# Patient Record
Sex: Female | Born: 1975 | Hispanic: Yes | Marital: Married | Smoking: Current every day smoker
Health system: Southern US, Community
[De-identification: ages and names within clinical notes are randomized; demographics above are authoritative.]

---

## 2019-09-28 ENCOUNTER — Other Ambulatory Visit: Payer: Self-pay

## 2019-09-28 DIAGNOSIS — Z20822 Contact with and (suspected) exposure to covid-19: Secondary | ICD-10-CM

## 2019-09-30 LAB — NOVEL CORONAVIRUS, NAA: SARS-CoV-2, NAA: NOT DETECTED

## 2020-04-03 ENCOUNTER — Other Ambulatory Visit: Payer: Self-pay

## 2020-04-03 DIAGNOSIS — Z1231 Encounter for screening mammogram for malignant neoplasm of breast: Secondary | ICD-10-CM

## 2020-04-18 ENCOUNTER — Ambulatory Visit
Admission: RE | Admit: 2020-04-18 | Discharge: 2020-04-18 | Disposition: A | Discharge status: Home / Self Care | Payer: No Typology Code available for payment source | Source: Ambulatory Visit | Attending: Obstetrics and Gynecology | Admitting: Obstetrics and Gynecology

## 2020-04-18 ENCOUNTER — Other Ambulatory Visit: Payer: Self-pay

## 2020-04-18 ENCOUNTER — Ambulatory Visit: Payer: Self-pay | Admitting: *Deleted

## 2020-04-18 VITALS — BP 143/86 | Temp 98.4°F | Wt 184.6 lb

## 2020-04-18 DIAGNOSIS — Z01419 Encounter for gynecological examination (general) (routine) without abnormal findings: Secondary | ICD-10-CM

## 2020-04-18 DIAGNOSIS — Z124 Encounter for screening for malignant neoplasm of cervix: Secondary | ICD-10-CM

## 2020-04-18 DIAGNOSIS — Z1231 Encounter for screening mammogram for malignant neoplasm of breast: Secondary | ICD-10-CM

## 2020-04-18 NOTE — Patient Instructions (Addendum)
Informed Jessica Hancock about breast self awareness. Patient received a Pap smear today. Let her know BCCCP will cover Pap smears and HPV typing every 5 years unless has a history of abnormal Pap smears. Referred patient to the [facility] of [facility] for a screening mammogram. Appointment scheduled for April 18, 2020 at 9:30am. Patient aware of appointment and will be there. Let patient know will follow up with her within the next week with results of her Pap smear and wet prep by phone. Informed patient that the Breast Center will follow-up wither her within the next couple of weeks with results of her mammogram by letter or phone. Jessica Hancock verbalized understanding.  Mila Homer, RN, FNP student 9:17 AM

## 2020-04-18 NOTE — Progress Notes (Signed)
[name] Jessica Hancock is a 44 y.o. G1P0 female who presents to [facility] clinic today with no complaints.    Pap Smear: Pap smear completed today. Last Pap smear was in 2016 in [place] and was normal per patient. Per patient she has no history of an abnormal Pap smear. Last Pap smear result is not available in Epic.   Physical exam: Breasts Breasts symmetrical. No skin abnormalities bilateral breasts. Nipple retraction bilateral breasts but this is normal per patient. No nipple discharge bilateral breasts. No lymphadenopathy. No lumps palpated bilateral breasts. There is an old scar in the left axillary region that the patient states was from self removal of a pimple.      Pelvic/Bimanual Ext Genitalia No lesions, no swelling and no discharge observed on external genitalia.        Vagina Vagina pink and normal texture. No lesions or discharge observed in vagina.        Cervix Cervix is present. Cervix pink and of normal texture. Cervix slightly friable. Light yellow discharge observed. Wet prep completed.   Uterus Uterus is present and palpable. Uterus in normal position and normal size.        Adnexae Bilateral ovaries present and palpable. No tenderness on palpation.         Rectovaginal No rectal exam completed today since patient had no rectal complaints. No skin abnormalities observed on exam.     Smoking History: Patient is a current smoker. She was referred to the [place] quit line.    Patient Navigation: Patient education provided. Access to services provided for patient through BCCCP program. Laveda Norman, Spanish interpreter, provided through [facility].    Breast and Cervical Cancer Risk Assessment: Patient does not have family history of breast cancer, known genetic mutations, or radiation treatment to the chest before age 89. Patient does not have history of cervical dysplasia, immunocompromised, or DES exposure in-utero.  Risk Assessment    Risk Scores       04/18/2020   Last edited by: Narda Rutherford, LPN   5-year risk: 0.5 %   Lifetime risk: 8.5 %          A: BCCCP exam with pap smear No complaints today.  P: Referred patient to the [facility] of [facility] for a screening mammogram. Appointment scheduled 04/18/2020 at 9:30am.  Mila Homer, RN, FNP student 04/18/2020 9:08 AM   Attestation of Supervision of Student:  I confirm that I have verified the information documented in the nurse practitioner student's note and that I have also personally reperformed the history, physical exam and all medical decision making activities.  I have verified that all services and findings are accurately documented in this student's note; and I agree with management and plan as outlined in the documentation. I have also made any necessary editorial changes.  Brannock, Kathaleen Maser, RN [facility] for [employer], [facility] 04/18/2020 9:28 AM

## 2020-04-19 LAB — CYTOLOGY - PAP
Comment: NEGATIVE
Diagnosis: NEGATIVE
High risk HPV: NEGATIVE

## 2020-04-19 LAB — CERVICOVAGINAL ANCILLARY ONLY
Bacterial Vaginitis (gardnerella): NEGATIVE
Candida Glabrata: NEGATIVE
Candida Vaginitis: NEGATIVE
Comment: NEGATIVE
Comment: NEGATIVE
Comment: NEGATIVE
Comment: NEGATIVE
Trichomonas: NEGATIVE

## 2020-04-23 ENCOUNTER — Other Ambulatory Visit: Payer: Self-pay | Admitting: Obstetrics and Gynecology

## 2020-04-23 DIAGNOSIS — R928 Other abnormal and inconclusive findings on diagnostic imaging of breast: Secondary | ICD-10-CM

## 2020-04-25 ENCOUNTER — Telehealth: Payer: Self-pay

## 2020-04-25 NOTE — Telephone Encounter (Signed)
Via Julie Sowell, Spanish Interpreter, Patient informed negative Pap/HPV results. Patient verbalized understanding.  

## 2020-04-30 ENCOUNTER — Ambulatory Visit
Admission: RE | Admit: 2020-04-30 | Discharge: 2020-04-30 | Disposition: A | Discharge status: Home / Self Care | Payer: No Typology Code available for payment source | Source: Ambulatory Visit | Attending: Obstetrics and Gynecology | Admitting: Obstetrics and Gynecology

## 2020-04-30 ENCOUNTER — Other Ambulatory Visit: Payer: Self-pay

## 2020-04-30 DIAGNOSIS — R928 Other abnormal and inconclusive findings on diagnostic imaging of breast: Secondary | ICD-10-CM

## 2020-07-23 ENCOUNTER — Inpatient Hospital Stay: Payer: No Typology Code available for payment source

## 2020-07-23 ENCOUNTER — Inpatient Hospital Stay: Payer: No Typology Code available for payment source | Attending: Genetic Counselor | Admitting: Genetic Counselor

## 2021-03-20 ENCOUNTER — Telehealth: Payer: Self-pay

## 2021-03-20 NOTE — Telephone Encounter (Signed)
Copied from CRM [id number]. Topic: General - Other >> Mar 20, 2021 12:06 PM Pawlus, Maxine Glenn A wrote: Reason for CRM: Pt requested a call back from [place] to apply for an [employer].

## 2021-03-20 NOTE — Telephone Encounter (Signed)
I return Pt call, she was inform that she need to see the provider first before she can apply for the financial program at [facility]

## 2021-03-25 ENCOUNTER — Other Ambulatory Visit: Payer: Self-pay | Admitting: Obstetrics and Gynecology

## 2021-03-25 DIAGNOSIS — Z1231 Encounter for screening mammogram for malignant neoplasm of breast: Secondary | ICD-10-CM

## 2021-05-15 ENCOUNTER — Ambulatory Visit: Admission: RE | Admit: 2021-05-15 | Payer: Self-pay | Source: Ambulatory Visit

## 2021-05-15 ENCOUNTER — Other Ambulatory Visit: Payer: Self-pay

## 2021-05-15 ENCOUNTER — Ambulatory Visit: Payer: No Typology Code available for payment source | Admitting: *Deleted

## 2021-05-15 VITALS — BP 120/82 | Wt 189.5 lb

## 2021-05-15 DIAGNOSIS — Z1239 Encounter for other screening for malignant neoplasm of breast: Secondary | ICD-10-CM

## 2021-05-15 DIAGNOSIS — N6320 Unspecified lump in the left breast, unspecified quadrant: Secondary | ICD-10-CM

## 2021-05-15 NOTE — Patient Instructions (Addendum)
Explained breast self awareness with Moishe Spice. Patient did not need a Pap smear today due to last Pap smear and HPV typing was 04/18/2020. Let her know BCCCP will cover Pap smears and HPV typing every 5 years unless has a history of abnormal Pap smears. Referred patient to the [facility] of [facility] for a diagnostic mammogram. Appointment scheduled Thursday, May 29, 2021 at 0940. Patient aware of appointment and will be there. Patient is a current smoker. Discussed smoking cessation with patient. Referred to the [facility] Quitline and gave resources to the free smoking cessation classes at [facility]. Blanchard Kelch Rosario Karlene Einstein verbalized understanding.  Darlina Mccaughey, Kathaleen Maser, RN 1:23 PM

## 2021-05-15 NOTE — Progress Notes (Addendum)
[name] is a 45 y.o. female who presents to [facility] clinic today with complaint of left breast lump x 2.5 months.    Pap Smear: Pap smear not completed today. Last Pap smear was 04/18/2020 at [facility] and was normal with negative HPV. Per patient has no history of an abnormal Pap smear. Last Pap smear result is available in Epic.   Physical exam: Breasts Breasts symmetrical. No skin abnormalities bilateral breasts. Bilateral nipple retraction that is greater within the left breast that per patient is normal for her. No nipple discharge bilateral breasts. No lymphadenopathy. No lumps palpated right breast. Palpated a lump within the left breast between 10 o'clock and 2 o'clock above the nipple. No complaints of pain or tenderness on exam.  Patient has two dark colors moles that have irregular border and a lighter color brown surrounding mole. Advised patient to follow-up with her PCP to have them checked. Patient doesn't have a PCP. Referred patient to the [facility] and she will be referred to a PCP for follow-up.  MS DIGITAL SCREENING TOMO BILATERAL  Result Date: 04/19/2020 CLINICAL DATA:  Screening. EXAM: DIGITAL SCREENING BILATERAL MAMMOGRAM WITH TOMO AND CAD COMPARISON:  None. ACR Breast Density Category c: The breast tissue is heterogeneously dense, which may obscure small masses. FINDINGS: In the left breast, a possible 12 o'clock retroareolar mass warrants further evaluation. In the right breast, no findings suspicious for malignancy. Images were processed with CAD. IMPRESSION: Further evaluation is suggested for possible mass in the left breast. RECOMMENDATION: Ultrasound of the left breast. (Code:US-L-64M) The patient will be contacted regarding the findings, and additional imaging will be scheduled. BI-RADS CATEGORY  0: Incomplete. Need additional imaging evaluation and/or prior mammograms for comparison. Electronically Signed   By: Beckie Salts M.D.   On:  04/19/2020 15:22     Pelvic/Bimanual Pap is not indicated today per BCCCP guidelines.   Smoking History: Patient is a current smoker. Discussed smoking cessation with patient. Referred to the [facility] Quitline and gave resources to the free smoking cessation classes at [facility].   Patient Navigation: Patient education provided. Access to services provided for patient through [facility]. Spanish interpreter Natale Lay from [facility] provided.    Breast and Cervical Cancer Risk Assessment: Patient has family history of her mother having breast cancer. Patient has no known genetic mutations or history of radiation treatment to the chest before age 37. Patient does not have history of cervical dysplasia, immunocompromised, or DES exposure in-utero.  Risk Assessment     Risk Scores       05/15/2021 04/18/2020   Last edited by: Jessica Rutherford, LPN McGill, Jessica Demetrius Charity, LPN   5-year risk: 1.1 % 0.5 %   Lifetime risk: 14.4 % 8.5 %            A: BCCCP exam without pap smear Complaint of left breast lump.  P: Referred patient to the [facility] for a diagnostic mammogram. Appointment scheduled Thursday, May 29, 2021 at 0940.  Jessica Heidelberg, RN 05/15/2021 1:23 PM

## 2021-05-21 ENCOUNTER — Other Ambulatory Visit: Payer: Self-pay

## 2021-05-21 ENCOUNTER — Inpatient Hospital Stay: Payer: Self-pay | Attending: Obstetrics and Gynecology | Admitting: *Deleted

## 2021-05-21 VITALS — BP 140/90 | Ht 63.0 in | Wt 187.9 lb

## 2021-05-21 DIAGNOSIS — Z0000 Encounter for general adult medical examination without abnormal findings: Secondary | ICD-10-CM

## 2021-05-21 NOTE — Progress Notes (Signed)
Wisewoman initial screening   Interpreter- Alene Mires, Haroldine Laws   Clinical Measurement:  Vitals:   05/21/21 0930  BP: (!) 142/90   Fasting Labs Drawn Today, will review with patient when they result.   Medical History:  Patient states that she  does not know if she has  high cholesterol, has high blood pressure and she does not have diabetes.  Medications:  Patient states that she does not take medication to lower cholesterol, blood pressure or blood sugar.  Patient does not take an aspirin a day to help prevent a heart attack or stroke.    Blood pressure, self measurement: Patient states that she does measure blood pressure from home. She checks her blood pressure N/A. She shares her readings with a health care provider: N/A.   Nutrition: Patient states that on average she eats 1 cups of fruit and 0 cups of vegetables per day. Patient states that she does not eat fish at least 2 times per week. Patient eats less than half servings of whole grains. Patient drinks less than 36 ounces of beverages with added sugar weekly: yes. Patient is currently watching sodium or salt intake: yes. In the past 7 days patient has consumed drinks containing alcohol on 1 days. On a day that patient consumes drinks containing alcohol on average 2 drinks are consumed.      Physical activity:  Patient states that she gets 0 minutes of moderate and 0 minutes of vigorous physical activity each week.  Smoking status:  Patient states that she has is a current smoker .   Quality of life:  Over the past 2 weeks patient states that she had little interest or pleasure in doing things: not at all. She has been feeling down, depressed or hopeless:not at all.    Risk reduction and counseling:   Smoking Cessation: Referred patient to the [place] Quitline for smoking cessation.  Health Coaching:  Spoke with patient about the daily recommendation for fruits and vegetables (2 cups of fruit and 3 cups of vegetables). Patient eats a  serving of fish week. Patient primarily consumes tilapia and sometimes fish. Gave recommendations for heart healthy fish that patient can add into diet (salmon, tuna, mackerel, sardines, sea bass or trout). Patient does not consume any whole grain foods. Gave recommendations for whole grains that she can add into diet (whole wheat pasta, whole wheat bread, brown rice, oatmeal and whole grain cereals). Patient does not currently exercise. Encouraged patient to try and start exercising at least 20 minutes a day.   Navigation:  I will notify patient of lab results.  Patient is aware of 2 more health coaching sessions and a follow up. Will refer patient to Internal Medicine for elevated blood pressure.   Time: 25 minutes

## 2021-05-22 LAB — LIPID PANEL
Chol/HDL Ratio: 3.5 ratio (ref 0.0–4.4)
Cholesterol, Total: 213 mg/dL — ABNORMAL HIGH (ref 100–199)
HDL: 61 mg/dL (ref >39)
LDL Chol Calc (NIH): 135 mg/dL — ABNORMAL HIGH (ref 0–99)
Triglycerides: 96 mg/dL (ref 0–149)
VLDL Cholesterol Cal: 17 mg/dL (ref 5–40)

## 2021-05-22 LAB — HEMOGLOBIN A1C
Est. average glucose Bld gHb Est-mCnc: 177 mg/dL
Hgb A1c MFr Bld: 7.8 % — ABNORMAL HIGH (ref 4.8–5.6)

## 2021-05-22 LAB — GLUCOSE, RANDOM: Glucose: 170 mg/dL — ABNORMAL HIGH (ref 65–99)

## 2021-05-28 ENCOUNTER — Telehealth: Payer: Self-pay

## 2021-05-28 NOTE — Telephone Encounter (Signed)
Health coaching 2   interpreter- Natale Lay, [facility]   Labs-213 cholesterol, 135 LDL cholesterol, 96  triglycerides, 61 HDL cholesterol, 7.8 hemoglobin A1C, 170 mean plasma glucose. Patient understands and is aware of her lab results.   Goals-   Reduce the amount of fried and fatty foods consumed. Try to grill, bake, broil or sautee foods in olive oil instead. Reduce the amount of red meat consumed. Substitute for lean protein like chicken, fish, [place] or lean cute of pork.  Increase the amount of whole grain foods and heart healthy fish. Gave recommendations for different whole grains and heart healthy fish that patient can try adding into diet. Decrease the amount of sweets and sugars consumed in both food and drinks. Patient consumes chocolates sometimes and ice cream/ice pops. Suggested to patient to look for sugar-free alternatives.  Reduce the amount of carbs consumed. Patient averages 6 tortillas a day and consumes rice twice a week and potatoes twice a week. Spoke with patient about cutting back on the amount of carbs consumed. Suggested to patient that she start measuring out her servings of rice and potatoes that she consumes to help with reducing the size of her portions. Also suggested to patient that she cut back by about 1/2 with the amount of tortillas consumed daily.    Navigation:  Patient is aware of 1 more health coaching sessions and a follow up.Will call patient with follow-up appointment information for Internal Medicine once appointment has been scheduled.   Time- 20 minutes

## 2021-05-29 ENCOUNTER — Ambulatory Visit
Admission: RE | Admit: 2021-05-29 | Discharge: 2021-05-29 | Disposition: A | Discharge status: Home / Self Care | Payer: No Typology Code available for payment source | Source: Ambulatory Visit | Attending: Obstetrics and Gynecology | Admitting: Obstetrics and Gynecology

## 2021-05-29 ENCOUNTER — Other Ambulatory Visit: Payer: Self-pay

## 2021-05-29 DIAGNOSIS — N6320 Unspecified lump in the left breast, unspecified quadrant: Secondary | ICD-10-CM

## 2021-06-10 ENCOUNTER — Encounter: Payer: No Typology Code available for payment source | Admitting: Student

## 2021-06-17 ENCOUNTER — Encounter: Payer: Self-pay | Admitting: Internal Medicine

## 2021-06-17 ENCOUNTER — Ambulatory Visit (INDEPENDENT_AMBULATORY_CARE_PROVIDER_SITE_OTHER): Payer: Self-pay | Admitting: Internal Medicine

## 2021-06-17 ENCOUNTER — Other Ambulatory Visit: Payer: Self-pay

## 2021-06-17 ENCOUNTER — Other Ambulatory Visit (HOSPITAL_COMMUNITY): Payer: Self-pay

## 2021-06-17 VITALS — BP 128/86 | HR 87 | Temp 98.2°F | Resp 24 | Ht 60.0 in | Wt 189.0 lb

## 2021-06-17 DIAGNOSIS — E119 Type 2 diabetes mellitus without complications: Secondary | ICD-10-CM

## 2021-06-17 DIAGNOSIS — E785 Hyperlipidemia, unspecified: Secondary | ICD-10-CM | POA: Insufficient documentation

## 2021-06-17 DIAGNOSIS — Z789 Other specified health status: Secondary | ICD-10-CM | POA: Insufficient documentation

## 2021-06-17 DIAGNOSIS — E669 Obesity, unspecified: Secondary | ICD-10-CM | POA: Insufficient documentation

## 2021-06-17 DIAGNOSIS — Z720 Tobacco use: Secondary | ICD-10-CM

## 2021-06-17 DIAGNOSIS — Z6836 Body mass index (BMI) 36.0-36.9, adult: Secondary | ICD-10-CM

## 2021-06-17 DIAGNOSIS — E1169 Type 2 diabetes mellitus with other specified complication: Secondary | ICD-10-CM | POA: Insufficient documentation

## 2021-06-17 DIAGNOSIS — E782 Mixed hyperlipidemia: Secondary | ICD-10-CM

## 2021-06-17 MED ORDER — METFORMIN HCL 500 MG PO TABS
500.0000 mg | ORAL_TABLET | Freq: Every day | ORAL | 2 refills | Status: DC
  Filled 2021-06-17: qty 30, 30d supply, fill #0
  Filled 2021-07-22: qty 30, 30d supply, fill #1
  Filled 2021-08-22: qty 30, 30d supply, fill #2

## 2021-06-17 NOTE — Patient Instructions (Addendum)
Thank you, [name] for allowing [place] to provide your care today. Today we discussed cholesterol, diabetes, and cardiovascular risk.    Labs/Tests Ordered: Lab Orders  No laboratory test(s) ordered today     Referrals Ordered:  Referral Orders  No referral(s) requested today     Medication Changes:   Meds ordered this encounter  Medications   metFORMIN (GLUCOPHAGE) 500 MG tablet    Sig: Take 1 tablet (500 mg total) by mouth daily with breakfast.    Dispense:  30 tablet    Refill:  2    IM program     Instructions:   Follow up: 3 months with Dr. Marchia Bond  Remember: If you have any questions or concerns, call our clinic at [phone] or after hours call [phone] and ask for the internal medicine resident on call.  Dellia Cloud, D.O. [facility]

## 2021-06-17 NOTE — Assessment & Plan Note (Addendum)
HPI: Patient presents for further evaluation and management of HLD.  Patient was recently diagnosed with hyperlipidemia from a recent wisewoman visit showing an elevated total cholesterol of 213 and LDL of 135.  Patient qualifies for moderate intensity statin due to her recent diagnosis of diabetes as well as her lifetime risk of ASCVD > 39%.  Patient's most significant risk factors for ASCVD include tobacco use, obesity, diabetes, and hyperlipidemia.  Lipid Panel: Component Value Date/Time   CHOL 213 (H) 05/21/2021 1004   TRIG 96 05/21/2021 1004   HDL 61 05/21/2021 1004   LDLCALC 135 (H) 05/21/2021 1004  ASCVD risk: 10-year 6.4%, Lifetime 50%  Assessment/Plan: Patient has a new diagnosis of mixed hyperlipidemia.  She will require moderate intensity statin in the near future.  We will hold off on starting this currently as she has expressed feeling overwhelmed by being diagnosed with diabetes and high cholesterol at the same time.  Patient has a follow-up appointment in 3 months.  -Start atorvastatin 20 mg at her follow-up appointment -We will need additional counseling regarding tobacco cessation at that time. -Follow-up in 3 months

## 2021-06-17 NOTE — Assessment & Plan Note (Addendum)
HPI: Patient presents Follow-up from her recent wisewoman visit with an elevated hemoglobin A1c.  Patient's A1c was 7.8, which is a new diagnosis of Type II diabetes mellitus.  Patient has a family history of diabetes in her mother.  Hemoglobin A1C Latest Ref Rng & Units 05/21/2021  HGBA1C 4.8 - 5.6 % 7.8(H)  Some recent data might be hidden    Assessment/Plan: Patient presents with a new diagnosis of diabetes mellitus.  The majority of this visit was spent counseling the patient regarding this diagnosis and its implications in her daily life.  We discussed general lifestyle changes and I gave her additional information for her to read at home regarding diabetes.  Patient agreed to start metformin 500 mg daily.  Would recommend adding GLP-1 agonist in the near future for additional weight control.  -Counseled regarding diabetes and lifestyle changes -Started metformin 500 mg daily -We will need to consider GLP-1 agonist in the near future -We will need diabetic foot and eye exam at 3 month follow up.

## 2021-06-17 NOTE — Progress Notes (Signed)
    Subjective:  CC: HLD   HPI:  [name] is a 45 y.o. female with a past medical history stated below and presents today for HLD. Please see problem based assessment and plan for additional details.  No past medical history on file.  No current outpatient medications on file prior to visit.   No current facility-administered medications on file prior to visit.    Family History  Problem Relation Age of Onset   Diabetes Mother    Breast cancer Mother    Hypertension Father     Social History   Socioeconomic History   Marital status: Married    Spouse name: Not on file   Number of children: 1   Years of education: Not on file   Highest education level: Associate degree: occupational, Scientist, product/process development, or vocational program  Occupational History   Not on file  Tobacco Use   Smoking status: Every Day    Packs/day: 0.10    Years: 15.00    Pack years: 1.50    Types: Cigarettes   Smokeless tobacco: Never   Tobacco comments:    2-3 cigarettes daily  Vaping Use   Vaping Use: Never used  Substance and Sexual Activity   Alcohol use: Yes    Comment: occasionally   Drug use: Never   Sexual activity: Yes    Birth control/protection: None  Other Topics Concern   Not on file  Social History Narrative   Not on file   Social Determinants of Health   Financial Resource Strain: Not on file  Food Insecurity: No Food Insecurity   Worried About Running Out of Food in the Last Year: Never true   Ran Out of Food in the Last Year: Never true  Transportation Needs: No Transportation Needs   Lack of Transportation (Medical): No   Lack of Transportation (Non-Medical): No  Physical Activity: Not on file  Stress: Not on file  Social Connections: Not on file  Intimate Partner Violence: Not on file    Review of Systems: ROS negative except for what is noted on the assessment and plan.  Objective:   Vitals:   06/17/21 0843  BP: 128/86  Pulse: 87   Resp: (!) 24  Temp: 98.2 F (36.8 C)  TempSrc: Oral  SpO2: 99%  Weight: 189 lb (85.7 kg)  Height: 5' (1.524 m)    Physical Exam: Gen: A&O x3 and in no apparent distress, well appearing and nourished. HEENT:    Head - normocephalic, atraumatic.    Eye - visual acuity grossly intact, conjunctiva clear, sclera non-icteric, EOM intact.    Mouth - No obvious caries or periodontal disease. Neck: no masses or nodules, AROM intact. CV: RRR, no murmurs, S1/S2 presents  Resp: Clear to ascultation bilaterally  Abd: BS (+) x4, soft, non-tender abdomen, without hepatosplenomegaly or masses MSK: Grossly normal AROM and strength x4 extremities. Skin: good skin turgor, no rashes, unusual bruising, or prominent lesions.  Neuro: No focal deficits, grossly normal sensation and coordination.  Psych: Oriented x3 and responding appropriately. Intact memory, normal mood, judgement, affect, and insight.    Assessment & Plan:  See Encounters Tab for problem based charting.  Patient discussed with Dr. Jeral Pinch, D.O. [facility]  PGY-3 Pager: [phone]  Phone: [phone] Date 06/17/2021  Time 8:53 AM

## 2021-06-18 ENCOUNTER — Encounter: Payer: Self-pay | Admitting: Internal Medicine

## 2021-06-18 NOTE — Assessment & Plan Note (Signed)
Patient endorses drinking 3-4 beers daily.  She denies any history of alcohol withdrawal or seizures.   Plan: -I counseled the patient regarding reduction of alcohol use -I educated her regarding the signs and symptoms of alcohol withdrawal and when to seek assistance.

## 2021-06-18 NOTE — Assessment & Plan Note (Addendum)
HPI: Patient presents for further evaluation and management of tobacco use.  Smokes 1/4 a pack per day for the past 15 years.    Assessment/Plan: Patient was counseled regarding her risk of cardiovascular disease with tobacco use.  She will need consistent follow-up for tobacco cessation. -Continue counseling regarding the importance of tobacco cessation.

## 2021-06-18 NOTE — Addendum Note (Signed)
Addended by: Burnell Blanks on: 06/18/2021 03:25 PM   Modules accepted: Level of Service

## 2021-06-18 NOTE — Assessment & Plan Note (Signed)
HPI: Patient presents for further evaluation and management of obesity.  Patient has a BMI of 36.9.  She additionally has diagnosis of diabetes.  Therefore she would likely benefit from GLP-1 agonist in the near future for assistance with weight loss.   Assessment/Plan: Consider starting GLP-1a in the near future.

## 2021-06-18 NOTE — Progress Notes (Signed)
Internal Medicine Clinic Attending  Case discussed with Dr. Coe  At the time of the visit.  We reviewed the resident's history and exam and pertinent patient test results.  I agree with the assessment, diagnosis, and plan of care documented in the resident's note.  

## 2021-06-30 ENCOUNTER — Ambulatory Visit: Payer: No Typology Code available for payment source

## 2021-06-30 ENCOUNTER — Ambulatory Visit: Payer: Self-pay | Attending: Internal Medicine | Admitting: Internal Medicine

## 2021-06-30 ENCOUNTER — Other Ambulatory Visit: Payer: Self-pay

## 2021-06-30 ENCOUNTER — Encounter: Payer: Self-pay | Admitting: Internal Medicine

## 2021-06-30 VITALS — BP 129/88 | HR 70 | Resp 16 | Ht 63.0 in | Wt 185.2 lb

## 2021-06-30 DIAGNOSIS — E785 Hyperlipidemia, unspecified: Secondary | ICD-10-CM

## 2021-06-30 DIAGNOSIS — Z803 Family history of malignant neoplasm of breast: Secondary | ICD-10-CM

## 2021-06-30 DIAGNOSIS — E1169 Type 2 diabetes mellitus with other specified complication: Secondary | ICD-10-CM

## 2021-06-30 DIAGNOSIS — E669 Obesity, unspecified: Secondary | ICD-10-CM

## 2021-06-30 DIAGNOSIS — F17200 Nicotine dependence, unspecified, uncomplicated: Secondary | ICD-10-CM

## 2021-06-30 DIAGNOSIS — F17290 Nicotine dependence, other tobacco product, uncomplicated: Secondary | ICD-10-CM

## 2021-06-30 DIAGNOSIS — Z23 Encounter for immunization: Secondary | ICD-10-CM

## 2021-06-30 DIAGNOSIS — I10 Essential (primary) hypertension: Secondary | ICD-10-CM

## 2021-06-30 DIAGNOSIS — F1010 Alcohol abuse, uncomplicated: Secondary | ICD-10-CM

## 2021-06-30 MED ORDER — TRUEPLUS LANCETS 28G MISC
4 refills | Status: DC
  Filled 2021-06-30 – 2022-04-22 (×2): qty 100, 25d supply, fill #0

## 2021-06-30 MED ORDER — TRUE METRIX METER W/DEVICE KIT
PACK | 0 refills | Status: AC
  Filled 2021-06-30: qty 1, 365d supply, fill #0

## 2021-06-30 MED ORDER — NICOTINE 14 MG/24HR TD PT24
14.0000 mg | MEDICATED_PATCH | Freq: Every day | TRANSDERMAL | 1 refills | Status: DC
  Filled 2021-06-30: qty 28, 28d supply, fill #0

## 2021-06-30 MED ORDER — TRUE METRIX BLOOD GLUCOSE TEST VI STRP
ORAL_STRIP | 12 refills | Status: DC
  Filled 2021-06-30: qty 100, 25d supply, fill #0
  Filled 2021-10-21: qty 100, 25d supply, fill #1
  Filled 2022-04-22: qty 100, 25d supply, fill #0

## 2021-06-30 MED ORDER — LOSARTAN POTASSIUM 25 MG PO TABS
25.0000 mg | ORAL_TABLET | Freq: Every day | ORAL | 4 refills | Status: DC
  Filled 2021-06-30: qty 30, 30d supply, fill #0
  Filled 2021-08-22: qty 30, 30d supply, fill #1

## 2021-06-30 NOTE — Progress Notes (Deleted)
Pt no showed this appt

## 2021-06-30 NOTE — Patient Instructions (Signed)
Diabetes mellitus y nutricin, en adultos Diabetes Mellitus and Nutrition, Adult Si sufre de diabetes, o diabetes mellitus, es muy importante tener hbitos alimenticios saludables debido a que sus niveles de azcar en la sangre (glucosa) se ven afectados en gran medida por lo que come y bebe. Comer alimentos saludables en las cantidades correctas, aproximadamente a la misma hora todos los das, lo ayudar a: Controlar la glucemia. Disminuir el riesgo de sufrir una enfermedad cardaca. Mejorar la presin arterial. Alcanzar o mantener un peso saludable. Qu puede afectar mi plan de alimentacin? Todas las personas que sufren de diabetes son diferentes y cada una tiene necesidades diferentes en cuanto a un plan de alimentacin. El mdico puede recomendarle que trabaje con un nutricionista para elaborar el mejor plan para usted. Su plan de alimentacin puede variar segn factores como: Las caloras que necesita. Los medicamentos que toma. Su peso. Sus niveles de glucemia, presin arterial y colesterol. Su nivel de actividad. Otras afecciones que tenga, como enfermedades cardacas o renales. Cmo me afectan los carbohidratos? Los carbohidratos, o hidratos de carbono, afectan su nivel de glucemia ms que cualquier otro tipo de alimento. La ingesta de carbohidratos naturalmente aumenta la cantidad de glucosa en la sangre. El recuento de carbohidratos es un mtodo destinado a llevar un registro de la cantidad de carbohidratos que se consumen. El recuento de carbohidratos es importante para mantener la glucemia a un nivel saludable, especialmente si utiliza insulina o toma determinados medicamentos por va oral para la diabetes. Es importante conocer la cantidad de carbohidratos que se pueden ingerir en cada comida sin correr ningn riesgo. Esto es diferente en cada persona. Su nutricionista puede ayudarlo a calcular la cantidad de carbohidratos que debe ingerir en cada comida y en cada refrigerio. Cmo  me afecta el alcohol? El alcohol puede provocar disminuciones sbitas de la glucemia (hipoglucemia), especialmente si utiliza insulina o toma determinados medicamentos por va oral para la diabetes. La hipoglucemia es una afeccin potencialmente mortal. Los sntomas de la hipoglucemia, como somnolencia, mareos y confusin, son similares a los sntomas de haber consumido demasiado alcohol. No beba alcohol si: Su mdico le indica no hacerlo. Est embarazada, puede estar embarazada o est tratando de quedar embarazada. Si bebe alcohol: No beba con el estmago vaco. Limite la cantidad que bebe: De 0 a 1 medida por da para las mujeres. De 0 a 2 medidas por da para los hombres. Est atento a la cantidad de alcohol que hay en las bebidas que toma. En los [place], una medida equivale a una botella de cerveza de 12 oz (355 ml), un vaso de vino de 5 oz (148 ml) o un vaso de una bebida alcohlica de alta graduacin de 1 oz (44 ml). Mantngase hidratado bebiendo agua, refrescos dietticos o t helado sin azcar. Tenga en cuenta que los refrescos comunes, los jugos y otras bebida para mezclar pueden contener mucha azcar y se deben contar como carbohidratos. Consejos para seguir este plan Leer las etiquetas de los alimentos Comience por leer el tamao de la porcin en la "Informacin nutricional" en las etiquetas de los alimentos envasados y las bebidas. La cantidad de caloras, carbohidratos, grasas y otros nutrientes mencionados en la etiqueta se basan en una porcin del alimento. Muchos alimentos contienen ms de una porcin por envase. Verifique la cantidad total de gramos (g) de carbohidratos totales en una porcin. Puede calcular la cantidad de porciones de carbohidratos al dividir el total de carbohidratos por 15. Por ejemplo, si un alimento tiene un   total de 30 g de carbohidratos totales por porcin, equivale a 2 porciones de carbohidratos. Verifique la cantidad de gramos (g) de grasas  saturadas y grasas trans de una porcin. Escoja alimentos que no contengan estas grasas o que su contenido de estas sea bajo. Verifique la cantidad de miligramos (mg) de sal (sodio) en una porcin. La mayora de las personas deben limitar la ingesta de sodio total a menos de 2300 mg por da. Siempre consulte la informacin nutricional de los alimentos etiquetados como "con bajo contenido de grasa" o "sin grasa". Estos alimentos pueden tener un mayor contenido de azcar agregada o carbohidratos refinados, y deben evitarse. Hable con su nutricionista para identificar sus objetivos diarios en cuanto a los nutrientes mencionados en la etiqueta. Al ir de compras Evite comprar alimentos procesados, enlatados o precocidos. Estos alimentos tienden a tener una mayor cantidad de grasa, sodio y azcar agregada. Compre en la zona exterior de la tienda de comestibles. Esta es la zona donde se encuentran con mayor frecuencia las frutas y las verduras frescas, los cereales a granel, las carnes frescas y los productos lcteos frescos. Al cocinar Utilice mtodos de coccin a baja temperatura, como hornear, en lugar de mtodos de coccin a alta temperatura, como frer en abundante aceite. Cocine con aceites saludables, como el aceite de oliva, canola o girasol. Evite cocinar con manteca, crema o carnes con alto contenido de grasa. Planificacin de las comidas Coma las comidas y los refrigerios regularmente, preferentemente a la misma hora todos los das. Evite pasar largos perodos de tiempo sin comer. Consuma alimentos ricos en fibra, como frutas frescas, verduras, frijoles y cereales integrales. Consulte a su nutricionista sobre cuntas porciones de carbohidratos puede consumir en cada comida. Consuma entre 4 y 6 onzas (entre 112 y 168 g) de protenas magras por da, como carnes magras, pollo, pescado, huevos o tofu. Una onza (oz) de protena magra equivale a: 1 onza (28 g) de carne, pollo o pescado. 1 huevo.  de  taza (62 g) de tofu. Coma algunos alimentos por da que contengan grasas saludables, como aguacates, frutos secos, semillas y pescado. Qu alimentos debo comer? Frutas Bayas. Manzanas. Naranjas. Duraznos. Damascos. Ciruelas. Uvas. Mango. Papaya. Granada. Kiwi. Cerezas. Verduras Lechuga. Espinaca. Verduras de hoja verde, que incluyen col rizada, acelga, hojas de berza y de mostaza. Remolachas. Coliflor. Repollo. Brcoli. Zanahorias. Judas verdes. Tomates. Pimientos. Cebollas. Pepinos. Coles de Bruselas. Granos Granos integrales, como panes, galletas, tortillas, cereales y pastas de salvado o integrales. Avena sin azcar. Quinua. Arroz integral o salvaje. Carnes y otras protenas Mariscos. Carne de ave sin piel. Cortes magros de ave y carne de res. Tofu. Frutos secos. Semillas. Lcteos Productos lcteos sin grasa o con bajo contenido de grasa, como leche, yogur y queso. Es posible que los productos que se enumeran ms arriba no constituyan una lista completa de los alimentos y las bebidas que puede tomar. Consulte a un nutricionista para obtener ms informacin. Qu alimentos debo evitar? Frutas Frutas enlatadas al almbar. Verduras Verduras enlatadas. Verduras congeladas con mantequilla o salsa de crema. Granos Productos elaborados con harina y harina blanca refinada, como panes, pastas, bocadillos y cereales. Evite todos los alimentos procesados. Carnes y otras protenas Cortes de carne con alto contenido de grasa. Carne de ave con piel. Carnes empanizadas o fritas. Carne procesada. Evite las grasas saturadas. Lcteos Yogur, queso o leche enteros. Bebidas Bebidas azucaradas, como gaseosas o t helado. Es posible que los productos que se enumeran ms arriba no constituyan una lista completa de   los alimentos y las bebidas que debe evitar. Consulte a un nutricionista para obtener ms informacin. Preguntas para hacerle al mdico Es necesario que me rena con un instructor en el cuidado  de la diabetes? Es necesario que me rena con un nutricionista? A qu nmero puedo llamar si tengo preguntas? Cules son los mejores momentos para controlar la glucemia? Dnde encontrar ms informacin: Asociacin Estadounidense de la Diabetes (American Diabetes Association): diabetes.org Academy of Nutrition and Dietetics (Academia de Nutricin y Diettica): www.eatright.org National Institute of Diabetes and Digestive and Kidney Diseases (Instituto Nacional de la Diabetes y las Enfermedades Digestivas y Renales): www.niddk.nih.gov Association of Diabetes Care and Education Specialists (Asociacin de Especialistas en Atencin y Educacin sobre la Diabetes): www.diabeteseducator.org Resumen Es importante tener hbitos alimenticios saludables debido a que sus niveles de azcar en la sangre (glucosa) se ven afectados en gran medida por lo que come y bebe. Un plan de alimentacin saludable lo ayudar a controlar la glucemia y mantener un estilo de vida saludable. El mdico puede recomendarle que trabaje con un nutricionista para elaborar el mejor plan para usted. Tenga en cuenta que los carbohidratos (hidratos de carbono) y el alcohol tienen efectos inmediatos en sus niveles de glucemia. Es importante contar los carbohidratos que ingiere y consumir alcohol con prudencia. Esta informacin no tiene como fin reemplazar el consejo del mdico. Asegrese de hacerle al mdico cualquier pregunta que tenga. Document Revised: 11/09/2019 Document Reviewed: 11/09/2019 Elsevier Patient Education  2021 Elsevier Inc.  

## 2021-06-30 NOTE — Addendum Note (Signed)
Addended by: Jonah Blue B on: 06/30/2021 12:29 PM   Modules accepted: Orders

## 2021-06-30 NOTE — Addendum Note (Signed)
Addended by: Jonah Blue B on: 06/30/2021 12:37 PM   Modules accepted: Level of Service

## 2021-06-30 NOTE — Progress Notes (Signed)
Patient ID: Jessica Hancock, female    DOB: 1976/08/02  MRN: [id number]  CC: New Patient (Initial Visit) and Diabetes   Subjective: Jessica Hancock is a 45 y.o. female who presents for new pt visit.  Her husband, Jamesetta Geralds, is with her Her concerns today include:  Pt with hx of DM, HL, obesity, tob dep, ETOH abuse.    DIABETES TYPE 2/Obesity Last A1C:   Lab Results  Component Value Date   HGBA1C 7.8 (H) 05/21/2021  Recently dx 06/18/2021 at [facility] resident clinic.  Started on Metformin Med Adherence:  [x]  Yes    []  No Medication side effects:  []  Yes    [x]  No Home Monitoring?  []  Yes    [x]  No Home glucose results range: Diet Adherence: []  Yes    [x]  No Exercise: []  Yes    [x]  No Hypoglycemic episodes?: []  Yes    []  No Numbness of the feet? []  Yes    [x]  No Retinopathy hx? []  Yes    []  No Last eye exam:  no blurred vision.  Comments:  I note that DBP has been elevated consistently on previous encounter with the health system.  No HA or dizziness  Tob dep:  smokes 2 cigars a day.  Smoked since age 60. Stopped for 4 yrs after her pregnancy.  Wants to quit. She is enrolled in state program to help quit.  Sent pamphlets.  Told they will send her patches but she has not received.  ETOH use disorder: drinks 3-4 12 oz beers a day.  HL:  recent LDL was 135.  Not on statin.  HM:  due for Tdap, Flu, Pneumonia vaccines.   MMG 1 mth ago.  Fhx of breast CA in mom. Patient Active Problem List   Diagnosis Date Noted   Diabetes Mellitus, Type 2 06/17/2021   Hyperlipidemia 06/17/2021   Obesity 06/17/2021   Tobacco use 06/17/2021   Alcohol consumption heavy 06/17/2021     Current Outpatient Medications on File Prior to Visit  Medication Sig Dispense Refill   metFORMIN (GLUCOPHAGE) 500 MG tablet Take 1 tablet (500 mg total) by mouth daily with breakfast. 30 tablet 2   No current facility-administered medications on file prior to visit.    No Known  Allergies  Social History   Socioeconomic History   Marital status: Married    Spouse name: Not on file   Number of children: 1   Years of education: Not on file   Highest education level: Associate degree: occupational, Hotel manager, or vocational program  Occupational History   Not on file  Tobacco Use   Smoking status: Every Day    Packs/day: 0.10    Years: 15.00    Pack years: 1.50    Types: Cigarettes   Smokeless tobacco: Never   Tobacco comments:    2-3 cigarettes daily  Vaping Use   Vaping Use: Never used  Substance and Sexual Activity   Alcohol use: Yes    Comment: occasionally   Drug use: Never   Sexual activity: Yes    Birth control/protection: None  Other Topics Concern   Not on file  Social History Narrative   Not on file   Social Determinants of Health   Financial Resource Strain: Not on file  Food Insecurity: No Food Insecurity   Worried About Running Out of Food in the Last Year: Never true   Ran Out of Food in the Last Year: Never true  Transportation Needs:  No Transportation Needs   Lack of Transportation (Medical): No   Lack of Transportation (Non-Medical): No  Physical Activity: Not on file  Stress: Not on file  Social Connections: Not on file  Intimate Partner Violence: Not on file    Family History  Problem Relation Age of Onset   Diabetes Mother    Breast cancer Mother    Hypertension Father     No past surgical history on file.  ROS: Review of Systems Negative except as stated above  PHYSICAL EXAM: BP 129/88   Pulse 70   Resp 16   Ht 5' 3"  (1.6 m)   Wt 185 lb 3.2 oz (84 kg)   LMP 06/04/2021   SpO2 97%   BMI 32.81 kg/m   Wt Readings from Last 3 Encounters:  06/30/21 185 lb 3.2 oz (84 kg)  06/17/21 189 lb (85.7 kg)  05/21/21 187 lb 14.4 oz (85.2 kg)  Repeat BP 138/90  Physical Exam  General appearance - alert, well appearing, middle-aged Hispanic female and in no distress Mental status - normal mood, behavior, speech,  dress, motor activity, and thought processes Eyes - pupils equal and reactive, extraocular eye movements intact Nose - normal and patent, no erythema, discharge or polyps Mouth - mucous membranes moist, pharynx normal without lesions Neck - supple, no significant adenopathy Chest - clear to auscultation, no wheezes, rales or rhonchi, symmetric air entry Heart - normal rate, regular rhythm, normal S1, S2, no murmurs, rubs, clicks or gallops Extremities - peripheral pulses normal, no pedal edema, no clubbing or cyanosis Skin: Erythematous rough patches over both cheeks consistent with rosacea  CMP Latest Ref Rng & Units 05/21/2021  Glucose 65 - 99 mg/dL 170(H)   Lipid Panel     Component Value Date/Time   CHOL 213 (H) 05/21/2021 1004   TRIG 96 05/21/2021 1004   HDL 61 05/21/2021 1004   CHOLHDL 3.5 05/21/2021 1004   LDLCALC 135 (H) 05/21/2021 1004    ASSESSMENT AND PLAN: 1. Type 2 diabetes mellitus with obesity (HCC) Last A1c was not at goal. Discussed the importance of healthy eating habits, regular aerobic exercise (at least 150 minutes a week as tolerated) and medication compliance to achieve or maintain control of diabetes and prevent complications. -advise yearly eye exams -rxn sent to pharmacy for DM testing supplies.  Advised to check once a day before breakfast and sometimes before dinner with goal being 90-130. - CBC - glucose blood (TRUE METRIX BLOOD GLUCOSE TEST) test strip; Use as instructed  Dispense: 100 each; Refill: 12 - Blood Glucose Monitoring Suppl (TRUE METRIX METER) w/Device KIT; Use as directed  Dispense: 1 kit; Refill: 0 - TRUEplus Lancets 28G MISC; Use as directed  Dispense: 100 each; Refill: 4 - Microalbumin / creatinine urine ratio - Comprehensive metabolic panel  2. Essential hypertension Patient with persistent elevation of diastolic blood pressure.  DASH diet discussed and encouraged.  Start low-dose Cozaar.  Advised patient that should she plan to get  pregnant or becomes pregnant she should stop the medication immediately and let us know.   - losartan (COZAAR) 25 MG tablet; Take 1 tablet (25 mg total) by mouth daily.  Dispense: 30 tablet; Refill: 4  3. Hyperlipidemia associated with type 2 diabetes mellitus ([place]) Ideally should be on statin therapy.  However I will wait to see what her LFTs look like before prescribing statin.  4. Tobacco dependence Pt is current smoker. Patient advised to quit smoking. Discussed health risks associated with smoking  including lung and other types of cancers, chronic lung diseases and CV risks.. Pt ready/not ready to give trail of quitting.   Discussed methods to help quit including quitting cold [place], use of NRT, Chantix and Bupropion.  Pt wanting to try: nicotine patches.  14 mg patch prescribed.  I went over with her how to use the patch. _3_ Minutes spent on counseling. F/U: Reassess progress on follow-up visit.  - nicotine (NICODERM CQ - DOSED IN MG/24 HOURS) 14 mg/24hr patch; Place 1 patch (14 mg total) onto the skin daily.  Dispense: 28 patch; Refill: 1  5. Alcohol use disorder, mild, abuse Went over how much alcohol in a day is too much for a female.  Currently she is drinking too much.  Advised to cut back or stop completely.  Discussed health risks associated with excessive alcohol use.  6. Need for influenza vaccination To be given today.  7. Need for Tdap vaccination To be given today.  8. Family history of breast cancer Patient with family history of breast cancer in her mother.  She recently had mammogram.  Patient was given the opportunity to ask questions.  Patient verbalized understanding of the plan and was able to repeat key elements of the plan.  [employer] Language interpreter used during this encounter. #[id number], , Rossana.  Orders Placed This Encounter  Procedures   CBC   Microalbumin / creatinine urine ratio   Comprehensive metabolic panel      Requested Prescriptions    Signed Prescriptions Disp Refills   glucose blood (TRUE METRIX BLOOD GLUCOSE TEST) test strip 100 each 12    Sig: Use as instructed   Blood Glucose Monitoring Suppl (TRUE METRIX METER) w/Device KIT 1 kit 0    Sig: Use as directed   TRUEplus Lancets 28G MISC 100 each 4    Sig: Use as directed   nicotine (NICODERM CQ - DOSED IN MG/24 HOURS) 14 mg/24hr patch 28 patch 1    Sig: Place 1 patch (14 mg total) onto the skin daily.   losartan (COZAAR) 25 MG tablet 30 tablet 4    Sig: Take 1 tablet (25 mg total) by mouth daily.    Return in about 4 months (around 10/30/2021) for Give appt with [facility] in 3 wks for BP check and to be given Prevnar 20.Karle Plumber, MD, FACP

## 2021-06-30 NOTE — Addendum Note (Signed)
Addended by: Carolynne Edouard R on: 06/30/2021 02:04 PM   Modules accepted: Orders

## 2021-07-01 ENCOUNTER — Other Ambulatory Visit: Payer: Self-pay

## 2021-07-01 LAB — CBC
Hematocrit: 43.7 % (ref 34.0–46.6)
Hemoglobin: 15.2 g/dL (ref 11.1–15.9)
MCH: 30.1 pg (ref 26.6–33.0)
MCHC: 34.8 g/dL (ref 31.5–35.7)
MCV: 87 fL (ref 79–97)
Platelets: 280 10*3/uL (ref 150–450)
RBC: 5.05 x10E6/uL (ref 3.77–5.28)
RDW: 13.1 % (ref 11.7–15.4)
WBC: 10.1 10*3/uL (ref 3.4–10.8)

## 2021-07-01 LAB — COMPREHENSIVE METABOLIC PANEL
ALT: 59 IU/L — ABNORMAL HIGH (ref 0–32)
AST: 75 IU/L — ABNORMAL HIGH (ref 0–40)
Albumin/Globulin Ratio: 1.8 (ref 1.2–2.2)
Albumin: 4.8 g/dL (ref 3.8–4.8)
Alkaline Phosphatase: 73 IU/L (ref 44–121)
BUN/Creatinine Ratio: 14 (ref 9–23)
BUN: 10 mg/dL (ref 6–24)
Bilirubin Total: 0.7 mg/dL (ref 0.0–1.2)
CO2: 21 mmol/L (ref 20–29)
Calcium: 9.9 mg/dL (ref 8.7–10.2)
Chloride: 101 mmol/L (ref 96–106)
Creatinine, Ser: 0.73 mg/dL (ref 0.57–1.00)
Globulin, Total: 2.6 g/dL (ref 1.5–4.5)
Glucose: 131 mg/dL — ABNORMAL HIGH (ref 65–99)
Potassium: 5 mmol/L (ref 3.5–5.2)
Sodium: 140 mmol/L (ref 134–144)
Total Protein: 7.4 g/dL (ref 6.0–8.5)
eGFR: 104 mL/min/{1.73_m2} (ref >59)

## 2021-07-01 LAB — MICROALBUMIN / CREATININE URINE RATIO
Creatinine, Urine: 192.4 mg/dL
Microalb/Creat Ratio: 16 mg/g{creat} (ref 0–29)
Microalbumin, Urine: 31.5 ug/mL

## 2021-07-01 NOTE — Progress Notes (Signed)
Liver function tests abnormal.  Likely due to excessive amounts of beer consumption daily.  Recommend that she cut back or better yet try to quit completely to prevent permanent damage to the liver.  We will recheck liver function tests on subsequent visit.

## 2021-07-02 ENCOUNTER — Telehealth: Payer: Self-pay

## 2021-07-02 NOTE — Telephone Encounter (Signed)
Contacted pt to go over lab results pt is aware and doesn't have any questions or concerns  [address]a Hancock ID- [phone]

## 2021-07-08 ENCOUNTER — Ambulatory Visit: Payer: Self-pay | Attending: Internal Medicine

## 2021-07-08 ENCOUNTER — Other Ambulatory Visit: Payer: Self-pay

## 2021-07-22 ENCOUNTER — Other Ambulatory Visit (HOSPITAL_COMMUNITY): Payer: Self-pay

## 2021-07-22 ENCOUNTER — Ambulatory Visit: Payer: Self-pay | Attending: Internal Medicine | Admitting: Pharmacist

## 2021-07-22 ENCOUNTER — Other Ambulatory Visit: Payer: Self-pay

## 2021-07-22 ENCOUNTER — Encounter: Payer: Self-pay | Admitting: Pharmacist

## 2021-07-22 VITALS — BP 123/79

## 2021-07-22 DIAGNOSIS — I10 Essential (primary) hypertension: Secondary | ICD-10-CM

## 2021-07-22 NOTE — Progress Notes (Signed)
   S:    PCP: Dr. Laural Benes  Patient arrives in good spirits. Presents to the clinic for hypertension evaluation, counseling, and management. Patient was referred and last seen by Primary Care Provider on 06/30/2021. BP was 129/88 at that visit. Losartan was added at that visit.    Medication adherence reported. She has taken losartan today. Denies any lip swelling or throat itching. Denies chest pain, dyspnea. Denies any HA or dizziness.   Current BP Medications include:  losartan 25 mg daily   Antihypertensives tried in the past include: none  Dietary habits include: compliant with salt restriction. Does admit to heavy caffeine (coffee) consumption.  Exercise habits include: none Family / Social history:  -FHX:  -Tobacco: admits to "sometimes smoking" but this is a reduction compared to her chart  -Alcohol: reports reduction d/t being informed of previous lab results    O:  Vitals:   07/22/21 1025  BP: 123/79   Home BP readings: none  Last 3 Office BP readings: BP Readings from Last 3 Encounters:  06/30/21 129/88  06/17/21 128/86  05/21/21 140/90    BMET    Component Value Date/Time   NA 140 06/30/2021 1212   K 5.0 06/30/2021 1212   CL 101 06/30/2021 1212   CO2 21 06/30/2021 1212   GLUCOSE 131 (H) 06/30/2021 1212   BUN 10 06/30/2021 1212   CREATININE 0.73 06/30/2021 1212   CALCIUM 9.9 06/30/2021 1212    Renal function: CrCl cannot be calculated (Patient's most recent lab result is older than the maximum 21 days allowed.).  Clinical ASCVD: No  The 10-year ASCVD risk score (Arnett DK, et al., 2019) is: 6.5%   Values used to calculate the score:     Age: 45 years     Sex: Female     Is Non-Hispanic African American: No     Diabetic: Yes     Tobacco smoker: Yes     Systolic Blood Pressure: 129 mmHg     Is BP treated: Yes     HDL Cholesterol: 61 mg/dL     Total Cholesterol: 213 mg/dL   A/P: Hypertension diagnosed currently at goal on current medications. BP  Goal = < 130/80 mmHg. Medication adherence may be suboptimal. Admits to missing 2-3 doses over the last week. Encouraged compliance.   -Continued current regimen.   -F/u labs ordered - on check 9/2, K came back at 5.0. Will check BMP today after addition of losartan.  -Counseled on lifestyle modifications for blood pressure control including reduced dietary sodium, increased exercise, adequate sleep. -HM: Prevnar-20 given. Pt is uninsured. Coordinated patient assistance with our pharmacy.   Results reviewed and written information provided.   Total time in face-to-face counseling 30 minutes.   F/U Clinic Visit in 1 month.    Butch Penny, PharmD, P[place]y [place], CPP Clinical Pharmacist [facility] [phone]

## 2021-07-23 ENCOUNTER — Telehealth: Payer: Self-pay

## 2021-07-23 LAB — BASIC METABOLIC PANEL
BUN/Creatinine Ratio: 16 (ref 9–23)
BUN: 12 mg/dL (ref 6–24)
CO2: 20 mmol/L (ref 20–29)
Calcium: 9.7 mg/dL (ref 8.7–10.2)
Chloride: 98 mmol/L (ref 96–106)
Creatinine, Ser: 0.75 mg/dL (ref 0.57–1.00)
Glucose: 216 mg/dL — ABNORMAL HIGH (ref 70–99)
Potassium: 4.3 mmol/L (ref 3.5–5.2)
Sodium: 134 mmol/L (ref 134–144)
eGFR: 101 mL/min/{1.73_m2} (ref >59)

## 2021-07-23 NOTE — Telephone Encounter (Signed)
Contacted pt to go over lab results pt is aware Interpreter: Mikle Bosworth

## 2021-08-12 IMAGING — MG DIGITAL DIAGNOSTIC BILAT W/ TOMO W/ CAD
6 of 10 series · 6 of 30 positions shown · non-contrast
Comparison: Previous exam(s).

CLINICAL DATA: 45-year-old female presenting for evaluation of a
palpable lump in the left breast.

EXAM:
DIGITAL DIAGNOSTIC BILATERAL MAMMOGRAM WITH TOMOSYNTHESIS AND CAD;
ULTRASOUND LEFT BREAST LIMITED
TECHNIQUE: Bilateral digital diagnostic mammography and breast tomosynthesis
was performed. The images were evaluated with computer-aided
detection.; Targeted ultrasound examination of the left breast was
performed.

[R MLO synth-2D]
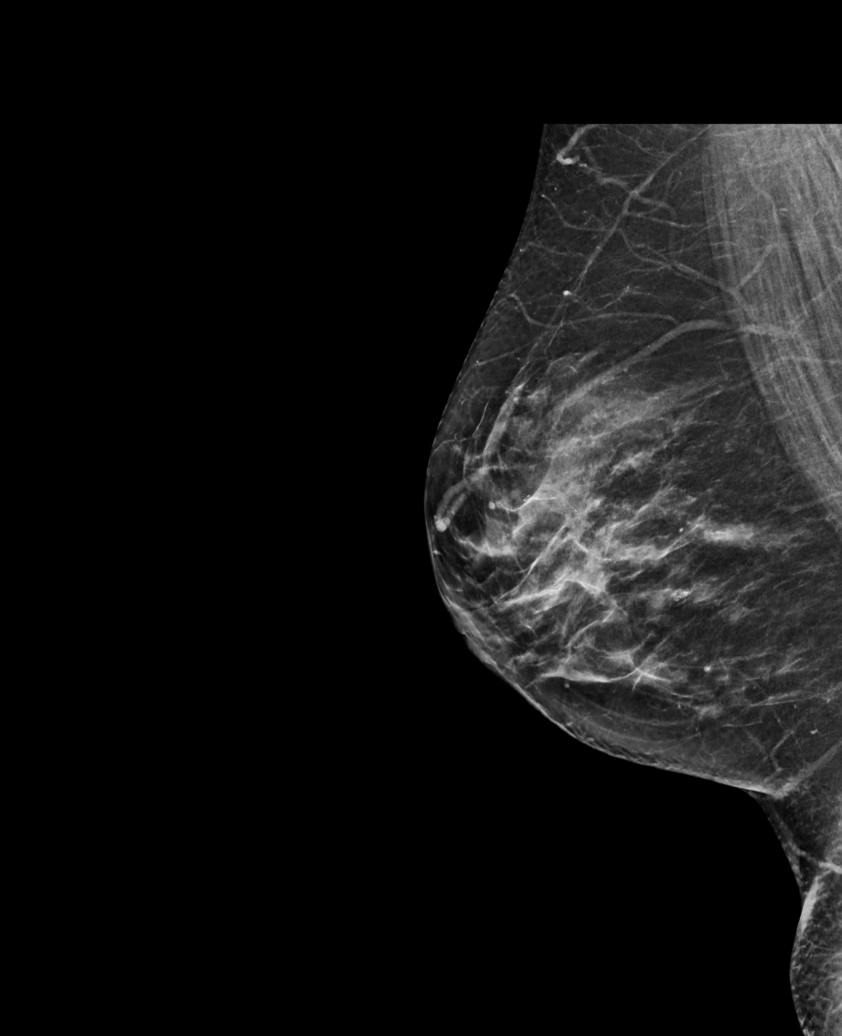

[L CC synth-2D]
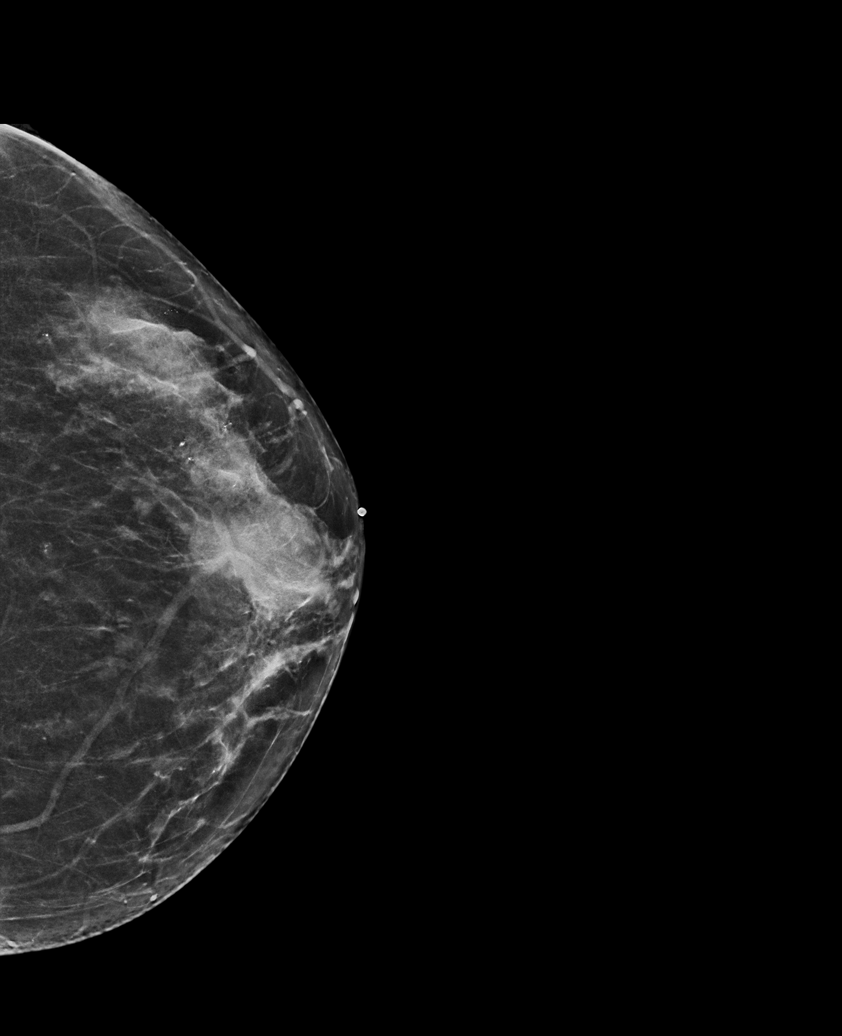

[R CC synth-2D]
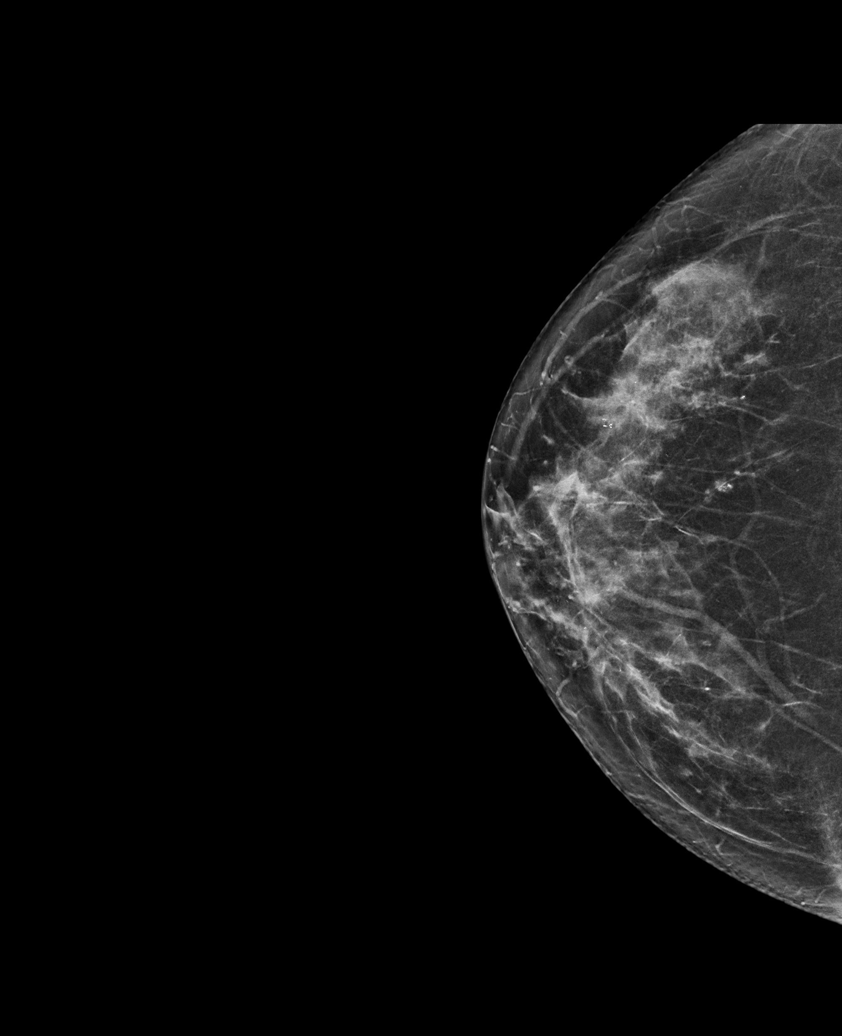

[L MLO synth-2D (1 of 2)]
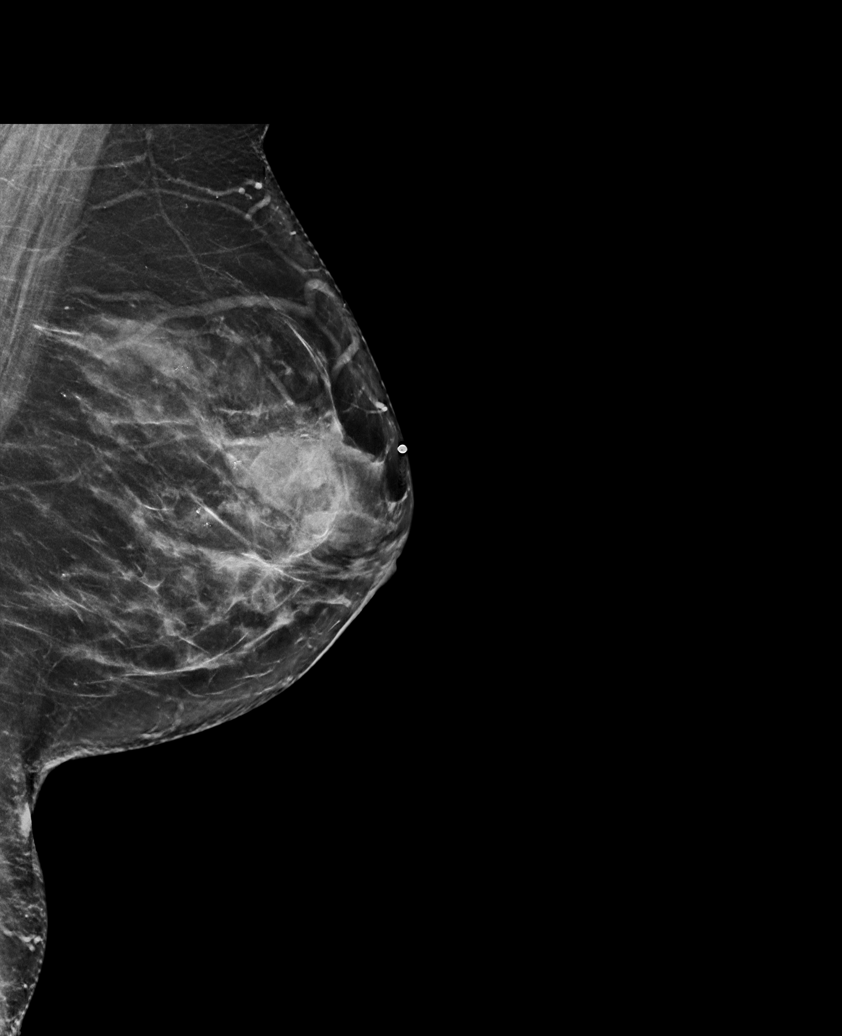

[L MLO synth-2D (2 of 2)]
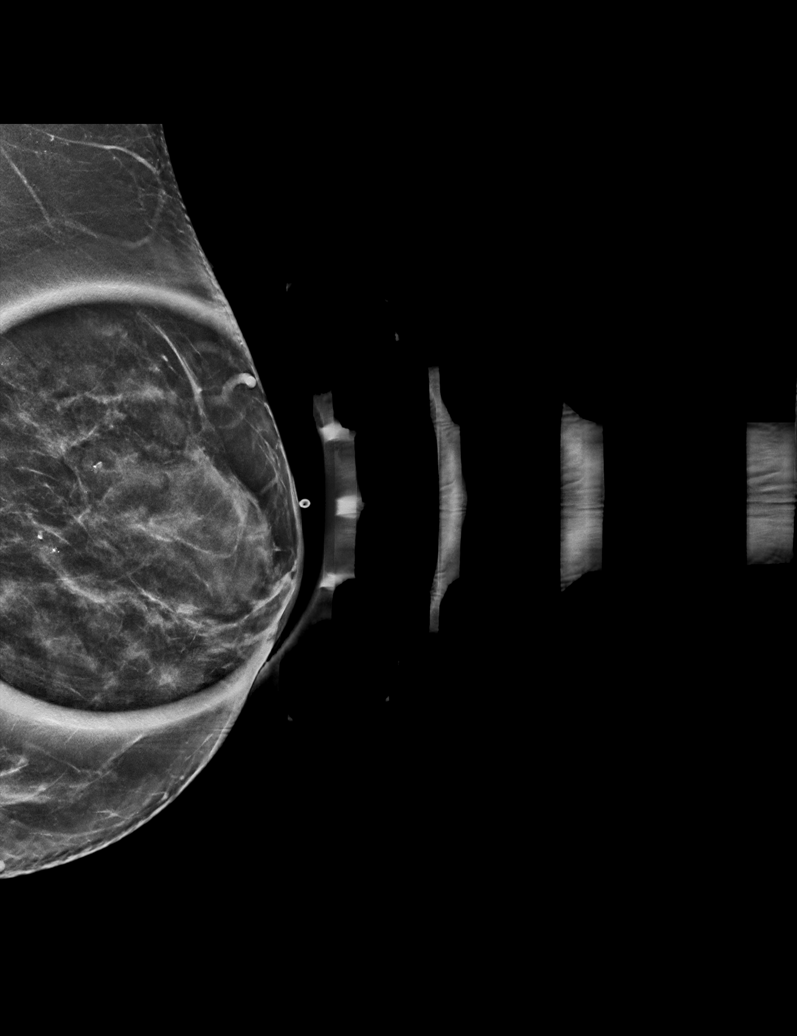

[R CC tomo · tomo slice 35/68.0]
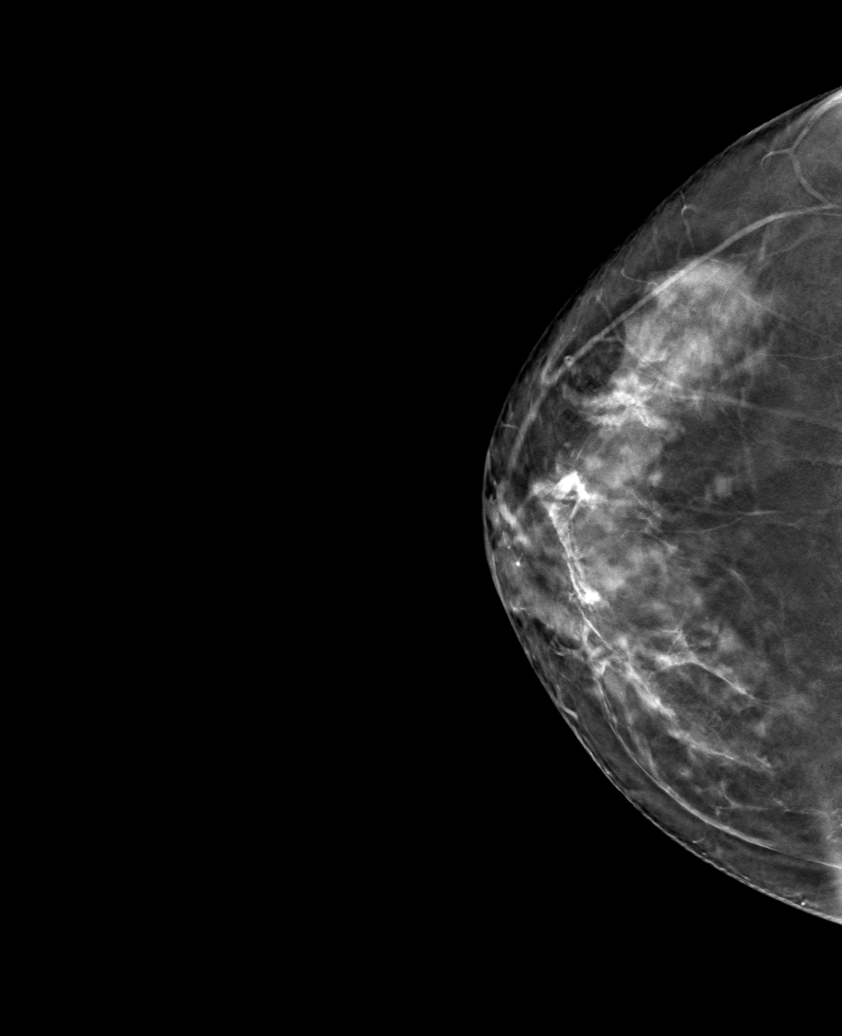

[6 of 30 positions shown; findings below may reference images not displayed]

ACR Breast Density Category c: The breast tissue is heterogeneously
dense, which may obscure small masses.
FINDINGS: A BB has been placed over the retroareolar left breast indicating
the palpable site of concern. There are 2 obscured oval masses deep
to the palpable marker. No other suspicious calcifications, masses
or areas of distortion are seen in the bilateral breasts.

Ultrasound targeted to the retroareolar left breast demonstrates 2
adjacent anechoic oval circumscribed masses at 12 o'clock and 1
o'clock. The cyst at 12 o'clock measures 2.7 x 2.2 x 2.5 cm. The
cyst at 1 o'clock measures 1.6 x 1.0 x 1.3 cm.
IMPRESSION: 1. The palpable area in the retroareolar left breast corresponds
with 2 adjacent benign cysts.

2.  No mammographic evidence of malignancy in the bilateral breasts.

RECOMMENDATION:
1.  Screening mammogram in one year.(Code:1X-0-EDW)

I have discussed the findings and recommendations with the patient.
If applicable, a reminder letter will be sent to the patient
regarding the next appointment.

BI-RADS CATEGORY  2: Benign.

## 2021-08-22 ENCOUNTER — Ambulatory Visit: Payer: Self-pay | Attending: Internal Medicine | Admitting: Pharmacist

## 2021-08-22 ENCOUNTER — Other Ambulatory Visit: Payer: Self-pay

## 2021-08-22 ENCOUNTER — Encounter: Payer: Self-pay | Admitting: Pharmacist

## 2021-08-22 VITALS — BP 145/83

## 2021-08-22 DIAGNOSIS — I10 Essential (primary) hypertension: Secondary | ICD-10-CM

## 2021-08-22 NOTE — Progress Notes (Signed)
   S:    PCP: Dr. Laural Benes  Patient arrives in good spirits. Presents to the clinic for hypertension evaluation, counseling, and management. Patient was referred and last seen by Primary Care Provider on 06/30/2021. Last seen by CPP on 07/22/2021. BP was good at that visit.   Medication adherence reported. She has taken losartan today. Denies any lip swelling or throat itching. Denies chest pain, dyspnea. Denies any HA or dizziness.   Current BP Medications include:  losartan 25 mg daily   Antihypertensives tried in the past include: none  Dietary habits include: compliant with salt restriction. Does admit to heavy caffeine (coffee) consumption.  Exercise habits include: none Family / Social history:  -FHx: DM, HTN -Tobacco: admits to "sometimes smoking" but this is a reduction compared to her chart  -Alcohol: reports reduction d/t being informed of previous lab results   O:  Vitals:   08/22/21 0956  BP: (!) 145/83   Home BP readings:  - No log but reports 2 SBP readings at home since last visit of 135 mmHg, 122 mmHg  Last 3 Office BP readings: BP Readings from Last 3 Encounters:  08/22/21 (!) 145/83  07/22/21 123/79  06/30/21 129/88   BMET    Component Value Date/Time   NA 134 07/22/2021 1043   K 4.3 07/22/2021 1043   CL 98 07/22/2021 1043   CO2 20 07/22/2021 1043   GLUCOSE 216 (H) 07/22/2021 1043   BUN 12 07/22/2021 1043   CREATININE 0.75 07/22/2021 1043   CALCIUM 9.7 07/22/2021 1043   Renal function: CrCl cannot be calculated (Patient's most recent lab result is older than the maximum 21 days allowed.).  Clinical ASCVD: No  The 10-year ASCVD risk score (Arnett DK, et al., 2019) is: 8.2%   Values used to calculate the score:     Age: 45 years     Sex: Female     Is Non-Hispanic African American: No     Diabetic: Yes     Tobacco smoker: Yes     Systolic Blood Pressure: 145 mmHg     Is BP treated: Yes     HDL Cholesterol: 61 mg/dL     Total Cholesterol: 213  mg/dL   A/P: Hypertension diagnosed currently above goal on current medications. BP Goal = < 130/80 mmHg. Medication adherence may be suboptimal. Admits to missing 2-3 doses over the last week. Encouraged compliance. Will hold off on any changes today.  -Continued current regimen.   -Counseled on lifestyle modifications for blood pressure control including reduced dietary sodium, increased exercise, adequate sleep. Results reviewed and written information provided.   Total time in face-to-face counseling 30 minutes.    F/U Clinic Visit in 2-3 weeks.Butch Penny, PharmD, BCACP, CPP Clinical Pharmacist [facility] [phone]

## 2021-09-15 NOTE — Progress Notes (Signed)
   S:    Patient arrives in good spirits. Presents to the clinic for hypertension evaluation, counseling, and management. Patient was referred and last seen by Primary Care Provider on 06/30/2021. Last seen by CPP on 08/22/2021 where BP was elevated compared to previous visits. Patient was encouraged to work on lifestyle modifications and follow up in one month.   Medication adherence: reports no missed doses in the last week.  Current BP Medications include:   -Losartan 25 mg QD  Antihypertensives tried in the past include: none  Dietary habits include: eats oatmeal and apples for breakfast, does not eat bread accept occassional tortillas, eats abundant vegetables, fish, etc.   Exercise habits include:none  Family / Social history:  -Every day smoker -Mother: DM, breast cancer -Father: HTN  ASCVD risk factors include: HTN, DM, smoking  Patient expressed desire to quit smoking and was counseled in detail on its benefits with explanations on strategies for smoking cessation.  O:  Vitals:   09/16/21 1201  BP: (!) 138/92  Pulse: 84   Home BP readings:  130/82 this morning before taking losartan  Last 3 Office BP readings: BP Readings from Last 3 Encounters:  09/16/21 (!) 138/92  08/22/21 (!) 145/83  07/22/21 123/79   BMET    Component Value Date/Time   NA 134 07/22/2021 1043   K 4.3 07/22/2021 1043   CL 98 07/22/2021 1043   CO2 20 07/22/2021 1043   GLUCOSE 216 (H) 07/22/2021 1043   BUN 12 07/22/2021 1043   CREATININE 0.75 07/22/2021 1043   CALCIUM 9.7 07/22/2021 1043    Renal function: CrCl cannot be calculated (Patient's most recent lab result is older than the maximum 21 days allowed.).  Clinical ASCVD: No  The 10-year ASCVD risk score (Arnett DK, et al., 2019) is: 7.5%   Values used to calculate the score:     Age: 81 years     Sex: Female     Is Non-Hispanic African American: No     Diabetic: Yes     Tobacco smoker: Yes     Systolic Blood Pressure: 138  mmHg     Is BP treated: Yes     HDL Cholesterol: 61 mg/dL     Total Cholesterol: 213 mg/dL  A/P: Hypertension currently above goal on current medications. BP Goal = < 130/80 mmHg. Medication adherence reported.  -Increase losartan to 50 mg daily. Informed patient that the dose may be decreased with incorporation of lifestyle changes to exercise and smoking -Start using nicotine patch with gum for smoking cessation -Incorporate 30 minutes per day of moderate intensity exercise 5 days per week -F/u labs ordered - CMP at next visit -Counseled on lifestyle modifications for blood pressure control including reduced dietary sodium, increased exercise, adequate sleep.  Results reviewed and written information provided. Total time in face-to-face counseling 30 minutes. F/U Clinic Visit with CPP in 1 month.   Thank you for allowing pharmacy to participate in this patient's care.  Enos Fling, PharmD PGY1 Pharmacy Resident 09/16/2021 12:02 PM

## 2021-09-16 ENCOUNTER — Ambulatory Visit: Payer: Self-pay | Attending: Internal Medicine | Admitting: Pharmacist

## 2021-09-16 ENCOUNTER — Other Ambulatory Visit: Payer: Self-pay

## 2021-09-16 ENCOUNTER — Encounter: Payer: Self-pay | Admitting: Pharmacist

## 2021-09-16 VITALS — BP 138/92 | HR 84

## 2021-09-16 DIAGNOSIS — I10 Essential (primary) hypertension: Secondary | ICD-10-CM

## 2021-09-16 MED ORDER — LOSARTAN POTASSIUM 50 MG PO TABS
50.0000 mg | ORAL_TABLET | Freq: Every day | ORAL | 2 refills | Status: DC
  Filled 2021-09-16: qty 30, 30d supply, fill #0
  Filled 2021-10-08: qty 30, 30d supply, fill #1
  Filled 2021-11-20: qty 30, 30d supply, fill #0

## 2021-09-18 ENCOUNTER — Other Ambulatory Visit: Payer: Self-pay

## 2021-10-08 ENCOUNTER — Other Ambulatory Visit: Payer: Self-pay | Admitting: Internal Medicine

## 2021-10-08 ENCOUNTER — Other Ambulatory Visit: Payer: Self-pay

## 2021-10-08 DIAGNOSIS — E1169 Type 2 diabetes mellitus with other specified complication: Secondary | ICD-10-CM

## 2021-10-09 ENCOUNTER — Other Ambulatory Visit: Payer: Self-pay

## 2021-10-09 ENCOUNTER — Other Ambulatory Visit: Payer: Self-pay | Admitting: Internal Medicine

## 2021-10-09 DIAGNOSIS — E1169 Type 2 diabetes mellitus with other specified complication: Secondary | ICD-10-CM

## 2021-10-09 MED ORDER — METFORMIN HCL 500 MG PO TABS
500.0000 mg | ORAL_TABLET | Freq: Every day | ORAL | 2 refills | Status: DC
  Filled 2021-10-09 – 2021-11-20 (×3): qty 30, 30d supply, fill #0
  Filled 2021-12-23: qty 30, 30d supply, fill #1

## 2021-10-16 ENCOUNTER — Other Ambulatory Visit: Payer: Self-pay

## 2021-10-21 ENCOUNTER — Encounter: Payer: Self-pay | Admitting: Pharmacist

## 2021-10-21 ENCOUNTER — Other Ambulatory Visit: Payer: Self-pay

## 2021-10-21 ENCOUNTER — Ambulatory Visit: Payer: Self-pay | Attending: Internal Medicine | Admitting: Pharmacist

## 2021-10-21 VITALS — BP 110/75

## 2021-10-21 DIAGNOSIS — I10 Essential (primary) hypertension: Secondary | ICD-10-CM

## 2021-10-21 NOTE — Progress Notes (Signed)
° °  S:    Patient arrives in good spirits. Presents to the clinic for hypertension evaluation, counseling, and management. Patient was referred and last seen by Primary Care Provider on 06/30/2021. Last seen by CPP on 09/16/2021. We increased her losartan dose at that visit.   Medication adherence: reports no missed doses in the last week.  Current BP Medications include:   -Losartan 50 mg daily  Antihypertensives tried in the past include: none  Dietary habits include: eats oatmeal and apples for breakfast, does not eat bread accept occassional tortillas, eats abundant vegetables, fish, etc.   Exercise habits include:none  Family / Social history:  -Every day smoker -Mother: DM, breast cancer -Father: HTN  ASCVD risk factors include: HTN, DM, smoking  Patient expressed desire to quit smoking and was counseled in detail on its benefits with explanations on strategies for smoking cessation.  O:  Vitals:   10/21/21 1005  BP: 110/75   Home BP readings:  122/80 this morning before taking losartan  Last 3 Office BP readings: BP Readings from Last 3 Encounters:  10/21/21 110/75  09/16/21 (!) 138/92  08/22/21 (!) 145/83   BMET    Component Value Date/Time   NA 134 07/22/2021 1043   K 4.3 07/22/2021 1043   CL 98 07/22/2021 1043   CO2 20 07/22/2021 1043   GLUCOSE 216 (H) 07/22/2021 1043   BUN 12 07/22/2021 1043   CREATININE 0.75 07/22/2021 1043   CALCIUM 9.7 07/22/2021 1043    Renal function: CrCl cannot be calculated (Patient's most recent lab result is older than the maximum 21 days allowed.).  Clinical ASCVD: No  The 10-year ASCVD risk score (Arnett DK, et al., 2019) is: 4.8%   Values used to calculate the score:     Age: 46 years     Sex: Female     Is Non-Hispanic African American: No     Diabetic: Yes     Tobacco smoker: Yes     Systolic Blood Pressure: 505 mmHg     Is BP treated: Yes     HDL Cholesterol: 61 mg/dL     Total Cholesterol: 213  mg/dL  A/P: Hypertension currently above goal on current medications. BP Goal = < 130/80 mmHg. Medication adherence reported.  -Continue losartan 50 mg daily.  -F/u labs ordered - CMP14 + eGFR -Counseled on lifestyle modifications for blood pressure control including reduced dietary sodium, increased exercise, adequate sleep.  Results reviewed and written information provided. Total time in face-to-face counseling 30 minutes. F/U Clinic Visit with PCP in two weeks.    Thank you for allowing pharmacy to participate in this patient's care.  Benard Halsted, PharmD, [facility] [place], [place] [phone]

## 2021-10-22 ENCOUNTER — Telehealth: Payer: Self-pay

## 2021-10-22 LAB — CMP14+EGFR
ALT: 18 IU/L (ref 0–32)
AST: 23 IU/L (ref 0–40)
Albumin/Globulin Ratio: 2 (ref 1.2–2.2)
Albumin: 4.7 g/dL (ref 3.8–4.8)
Alkaline Phosphatase: 75 IU/L (ref 44–121)
BUN/Creatinine Ratio: 14 (ref 9–23)
BUN: 11 mg/dL (ref 6–24)
Bilirubin Total: 0.6 mg/dL (ref 0.0–1.2)
CO2: 17 mmol/L — ABNORMAL LOW (ref 20–29)
Calcium: 9.4 mg/dL (ref 8.7–10.2)
Chloride: 101 mmol/L (ref 96–106)
Creatinine, Ser: 0.78 mg/dL (ref 0.57–1.00)
Globulin, Total: 2.3 g/dL (ref 1.5–4.5)
Glucose: 126 mg/dL — ABNORMAL HIGH (ref 70–99)
Potassium: 4.5 mmol/L (ref 3.5–5.2)
Sodium: 139 mmol/L (ref 134–144)
Total Protein: 7 g/dL (ref 6.0–8.5)
eGFR: 96 mL/min/{1.73_m2} (ref >59)

## 2021-10-22 NOTE — Telephone Encounter (Signed)
Contacted pt to go over lab results pt is aware and doesn't have any questions or concerns Jessica Hancock spoke to patient

## 2021-11-03 ENCOUNTER — Other Ambulatory Visit: Payer: Self-pay

## 2021-11-03 ENCOUNTER — Ambulatory Visit: Payer: Self-pay | Attending: Internal Medicine | Admitting: Internal Medicine

## 2021-11-03 ENCOUNTER — Encounter: Payer: Self-pay | Admitting: Internal Medicine

## 2021-11-03 VITALS — BP 128/86 | HR 80 | Resp 16 | Wt 183.4 lb

## 2021-11-03 DIAGNOSIS — E669 Obesity, unspecified: Secondary | ICD-10-CM

## 2021-11-03 DIAGNOSIS — I10 Essential (primary) hypertension: Secondary | ICD-10-CM

## 2021-11-03 DIAGNOSIS — Z114 Encounter for screening for human immunodeficiency virus [HIV]: Secondary | ICD-10-CM

## 2021-11-03 DIAGNOSIS — F17200 Nicotine dependence, unspecified, uncomplicated: Secondary | ICD-10-CM

## 2021-11-03 DIAGNOSIS — E1169 Type 2 diabetes mellitus with other specified complication: Secondary | ICD-10-CM

## 2021-11-03 DIAGNOSIS — Z1159 Encounter for screening for other viral diseases: Secondary | ICD-10-CM

## 2021-11-03 DIAGNOSIS — E785 Hyperlipidemia, unspecified: Secondary | ICD-10-CM

## 2021-11-03 LAB — GLUCOSE, POCT (MANUAL RESULT ENTRY): POC Glucose: 133 mg/dL — AB (ref 70–99)

## 2021-11-03 LAB — POCT GLYCOSYLATED HEMOGLOBIN (HGB A1C): HbA1c, POC (controlled diabetic range): 6.4 % (ref 0.0–7.0)

## 2021-11-03 MED ORDER — ATORVASTATIN CALCIUM 10 MG PO TABS
10.0000 mg | ORAL_TABLET | Freq: Every day | ORAL | 5 refills | Status: DC
  Filled 2021-11-03: qty 30, 30d supply, fill #0
  Filled 2021-12-09: qty 30, 30d supply, fill #1
  Filled 2022-01-23 – 2022-03-26 (×3): qty 30, 30d supply, fill #2
  Filled 2022-05-11: qty 30, 30d supply, fill #3
  Filled 2022-06-09: qty 30, 30d supply, fill #4
  Filled 2022-07-08: qty 30, 30d supply, fill #5

## 2021-11-03 NOTE — Progress Notes (Signed)
Patient ID: Jessica Hancock, female    DOB: 1976-01-04  MRN: [id number]  CC: Diabetes and Hypertension   Subjective: Jessica Hancock is a 46 y.o. female who presents for chronic ds management Her concerns today include:  Pt with hx of DM, HL, HTN, obesity, tob dep, ETOH use disorder.     DIABETES TYPE 2 Last A1C:   Results for orders placed or performed in visit on 11/03/21  POCT glucose (manual entry)  Result Value Ref Range   POC Glucose 133 (A) 70 - 99 mg/dl  POCT glycosylated hemoglobin (Hb A1C)  Result Value Ref Range   Hemoglobin A1C     HbA1c POC (<> result, manual entry)     HbA1c, POC (prediabetic range)     HbA1c, POC (controlled diabetic range) 6.4 0.0 - 7.0 %    Med Adherence:  _0  Yes - Metformin    _1  No Medication side effects:  _2  Yes    _3  No Home Monitoring?  _4  Yes before BF every morning    Home glucose results range: 122-135 Diet Adherence: _5  Yes -down 6 lbs since 05/2021   _6  No Exercise: _7  Yes    _8  No Hypoglycemic episodes?: _9  Yes    _10  No Numbness of the feet? _11  Yes    _12  No Retinopathy hx? _13  Yes    _14  No Last eye exam: over due for eye exam.  No insurance Comments:   HTN:  taking and tolerating Cozaar 50 mg.  Saw Clinical pharmacist a few times since last visit. Last seen 10/21/2021 and BP was good Has home device.  She has been checking intermittently.  Range 120-125/80-81 Limits salt in foods.  HL:  LDL was 135 on last visit.  I held off on statin as her AST/ALT were elevated at 75/59.  Repeat done 2 wks ago was nl.  Pt has cut back on drinking to just 2-3 can beers on Saturday OR Sunday.    Tob dep:  on last visit she was smoking 2 cigars a day.  Indicated desire to quit and wanted to try the patches.  She discontinued use due to nausea.  Reports she has cut back significantly - sometimes once a wk.    HM:  due for hep C and HIV screen which she is agreeable to having done Patient Active Problem List    Diagnosis Date Noted   Diabetes Mellitus, Type 2 06/17/2021   Hyperlipidemia 06/17/2021   Obesity 06/17/2021   Tobacco use 06/17/2021   Alcohol consumption heavy 06/17/2021     Current Outpatient Medications on File Prior to Visit  Medication Sig Dispense Refill   Blood Glucose Monitoring Suppl (TRUE METRIX METER) w/Device KIT Use as directed 1 kit 0   glucose blood (TRUE METRIX BLOOD GLUCOSE TEST) test strip Use as instructed 100 each 12   losartan (COZAAR) 50 MG tablet Take 1 tablet (50 mg total) by mouth daily. 30 tablet 2   metFORMIN (GLUCOPHAGE) 500 MG tablet Take 1 tablet (500 mg total) by mouth daily with breakfast. 30 tablet 2   nicotine (NICODERM CQ - DOSED IN MG/24 HOURS) 14 mg/24hr patch Place 1 patch (14 mg total) onto the skin daily. 28 patch 1   TRUEplus Lancets 28G MISC Use as directed 100 each 4   No current facility-administered medications on file prior to visit.    No Known Allergies  Social History   Socioeconomic History   Marital status: Married  Spouse name: Not on file   Number of children: 1   Years of education: Not on file   Highest education level: Associate degree: occupational, Hotel manager, or vocational program  Occupational History   Not on file  Tobacco Use   Smoking status: Every Day    Packs/day: 0.10    Years: 15.00    Pack years: 1.50    Types: Cigarettes   Smokeless tobacco: Never   Tobacco comments:    2-3 cigarettes daily  Vaping Use   Vaping Use: Never used  Substance and Sexual Activity   Alcohol use: Yes    Comment: occasionally   Drug use: Never   Sexual activity: Yes    Birth control/protection: None  Other Topics Concern   Not on file  Social History Narrative   Not on file   Social Determinants of Health   Financial Resource Strain: Not on file  Food Insecurity: No Food Insecurity   Worried About Running Out of Food in the Last Year: Never true   Ran Out of Food in the Last Year: Never true  Transportation  Needs: No Transportation Needs   Lack of Transportation (Medical): No   Lack of Transportation (Non-Medical): No  Physical Activity: Not on file  Stress: Not on file  Social Connections: Not on file  Intimate Partner Violence: Not on file    Family History  Problem Relation Age of Onset   Diabetes Mother    Breast cancer Mother    Hypertension Father     No past surgical history on file.  ROS: Review of Systems Negative except as stated above  PHYSICAL EXAM: BP 128/86    Pulse 80    Resp 16    Wt 183 lb 6.4 oz (83.2 kg)    SpO2 97%    BMI 32.49 kg/m   Wt Readings from Last 3 Encounters:  11/03/21 183 lb 6.4 oz (83.2 kg)  06/30/21 185 lb 3.2 oz (84 kg)  06/17/21 189 lb (85.7 kg)    Physical Exam  General appearance - alert, well appearing, and in no distress Mental status - normal mood, behavior, speech, dress, motor activity, and thought processes Neck - supple, no significant adenopathy Chest - clear to auscultation, no wheezes, rales or rhonchi, symmetric air entry Heart - normal rate, regular rhythm, normal S1, S2, no murmurs, rubs, clicks or gallops Extremities - peripheral pulses normal, no pedal edema, no clubbing or cyanosis Diabetic Foot Exam - Simple   No data filed      CMP Latest Ref Rng & Units 10/21/2021 07/22/2021 06/30/2021  Glucose 70 - 99 mg/dL 126(H) 216(H) 131(H)  BUN 6 - 24 mg/dL _0 Creatinine 0.57 - 1.00 mg/dL 0.78 0.75 0.73  Sodium 134 - 144 mmol/L 139 134 140  Potassium 3.5 - 5.2 mmol/L 4.5 4.3 5.0  Chloride 96 - 106 mmol/L 101 98 101  CO2 20 - 29 mmol/L 17(L) 20 21  Calcium 8.7 - 10.2 mg/dL 9.4 9.7 9.9  Total Protein 6.0 - 8.5 g/dL 7.0 - 7.4  Total Bilirubin 0.0 - 1.2 mg/dL 0.6 - 0.7  Alkaline Phos 44 - 121 IU/L 75 - 73  AST 0 - 40 IU/L 23 - 75(H)  ALT 0 - 32 IU/L 18 - 59(H)   Lipid Panel     Component Value Date/Time   CHOL 213 (H) 05/21/2021 1004   TRIG 96 05/21/2021 1004   HDL 61 05/21/2021 1004   CHOLHDL 3.5 05/21/2021  1004   LDLCALC 135 (H) 05/21/2021 1004    CBC    Component Value Date/Time   WBC 10.1 06/30/2021 1212   RBC 5.05 06/30/2021 1212   HGB 15.2 06/30/2021 1212   HCT 43.7 06/30/2021 1212   PLT 280 06/30/2021 1212   MCV 87 06/30/2021 1212   MCH 30.1 06/30/2021 1212   MCHC 34.8 06/30/2021 1212   RDW 13.1 06/30/2021 1212    ASSESSMENT AND PLAN:  1. Type 2 diabetes mellitus with obesity (HCC) At goal.  Commended her on getting her diabetes under control. Encouraged her to continue healthy eating habits and metformin. Encouraged her to get in some form of moderate intensity exercise 3 to 4 days a week for 30 minutes. - POCT glucose (manual entry) - POCT glycosylated hemoglobin (Hb A1C)  2. Essential hypertension Close to goal.  No dose change made to Cozaar as home blood pressure readings have been good.  She will continue with Cozaar 50 mg daily.  3. Hyperlipidemia associated with type 2 diabetes mellitus ([place]) Now that her LFTs have normalized, she is agreeable to starting low-dose atorvastatin to get her cholesterol under better control.  After she has been on the medicine for 1 month, she will return to the lab for repeat liver function test to make sure they are staying normal. -Commended her on cutting back on alcohol use.  However, I advised of the recommendation for women's that no more than 1 standard drink a day.  So when she drinks on the weekend it should be no more than one 12 oz beer a day. - atorvastatin (LIPITOR) 10 MG tablet; Take 1 tablet (10 mg total) by mouth daily.  Dispense: 30 tablet; Refill: 5  4. Tobacco dependence Commended her on significantly cutting back.  Encouraged her to try to remain tobacco free and to set a quit date where she would not smoke any more.  Patient agreeable to this.  5. Screening for HIV (human immunodeficiency virus) - Hepatic Function Panel; Future - HIV antibody (with reflex); Future  6. Need for hepatitis C screening test - HCV Ab  w Reflex to Quant PCR; Future   [employer] Language interpreter used during this encounter. #[id number], Kathlee Nations  Patient was given the opportunity to ask questions.  Patient verbalized understanding of the plan and was able to repeat key elements of the plan.   Orders Placed This Encounter  Procedures   Hepatic Function Panel   HIV antibody (with reflex)   HCV Ab w Reflex to Quant PCR   POCT glucose (manual entry)   POCT glycosylated hemoglobin (Hb A1C)     Requested Prescriptions   Signed Prescriptions Disp Refills   atorvastatin (LIPITOR) 10 MG tablet 30 tablet 5    Sig: Take 1 tablet (10 mg total) by mouth daily.    Return in about 4 months (around 03/03/2022) for Give lab appt in 1 mth.  Karle Plumber, MD, FACP

## 2021-11-06 ENCOUNTER — Other Ambulatory Visit: Payer: Self-pay

## 2021-11-20 ENCOUNTER — Other Ambulatory Visit: Payer: Self-pay

## 2021-11-20 ENCOUNTER — Other Ambulatory Visit (HOSPITAL_COMMUNITY): Payer: Self-pay

## 2021-12-04 ENCOUNTER — Other Ambulatory Visit: Payer: Self-pay

## 2021-12-04 ENCOUNTER — Ambulatory Visit: Payer: Self-pay | Attending: Internal Medicine

## 2021-12-04 DIAGNOSIS — Z1159 Encounter for screening for other viral diseases: Secondary | ICD-10-CM

## 2021-12-04 DIAGNOSIS — Z114 Encounter for screening for human immunodeficiency virus [HIV]: Secondary | ICD-10-CM

## 2021-12-05 ENCOUNTER — Telehealth: Payer: Self-pay

## 2021-12-05 LAB — HEPATIC FUNCTION PANEL
ALT: 10 IU/L (ref 0–32)
AST: 15 IU/L (ref 0–40)
Albumin: 4.8 g/dL (ref 3.8–4.8)
Alkaline Phosphatase: 62 IU/L (ref 44–121)
Bilirubin Total: 0.7 mg/dL (ref 0.0–1.2)
Bilirubin, Direct: 0.18 mg/dL (ref 0.00–0.40)
Total Protein: 6.6 g/dL (ref 6.0–8.5)

## 2021-12-05 LAB — HIV ANTIBODY (ROUTINE TESTING W REFLEX): HIV Screen 4th Generation wRfx: NONREACTIVE

## 2021-12-05 LAB — HCV AB W REFLEX TO QUANT PCR: HCV Ab: NONREACTIVE

## 2021-12-05 LAB — HCV INTERPRETATION

## 2021-12-05 NOTE — Telephone Encounter (Signed)
[employer] interpre[employer] [place]  Id# [id number]  contacted pt to go over lab results pt is aware and doesn't have any questions or concerns  °

## 2021-12-09 ENCOUNTER — Other Ambulatory Visit: Payer: Self-pay

## 2021-12-23 ENCOUNTER — Other Ambulatory Visit: Payer: Self-pay | Admitting: Internal Medicine

## 2021-12-23 ENCOUNTER — Other Ambulatory Visit: Payer: Self-pay

## 2021-12-24 ENCOUNTER — Other Ambulatory Visit: Payer: Self-pay

## 2021-12-24 MED ORDER — LOSARTAN POTASSIUM 50 MG PO TABS
50.0000 mg | ORAL_TABLET | Freq: Every day | ORAL | 2 refills | Status: DC
  Filled 2021-12-24: qty 30, 30d supply, fill #0
  Filled 2022-01-29: qty 30, 30d supply, fill #1
  Filled 2022-03-06: qty 30, 30d supply, fill #2

## 2021-12-24 NOTE — Telephone Encounter (Signed)
Requested Prescriptions  ?Pending Prescriptions Disp Refills  ?? losartan (COZAAR) 50 MG tablet 30 tablet 2  ?  Sig: Take 1 tablet (50 mg total) by mouth daily.  ?  ? Cardiovascular:  Angiotensin Receptor Blockers Passed - 12/23/2021  3:31 PM  ?  ?  Passed - Cr in normal range and within 180 days  ?  Creatinine, Ser  ?Date Value Ref Range Status  ?10/21/2021 0.78 0.57 - 1.00 mg/dL Final  ?   ?  ?  Passed - K in normal range and within 180 days  ?  Potassium  ?Date Value Ref Range Status  ?10/21/2021 4.5 3.5 - 5.2 mmol/L Final  ?   ?  ?  Passed - Patient is not pregnant  ?  ?  Passed - Last BP in normal range  ?  BP Readings from Last 1 Encounters:  ?11/03/21 128/86  ?   ?  ?  Passed - Valid encounter within last 6 months  ?  Recent Outpatient Visits   ?      ? 1 month ago Type 2 diabetes mellitus with obesity (HCC)  ? [facility] Marcine Matar, MD  ? 2 months ago Essential hypertension  ? [facility] Lois Huxley, Cornelius Moras, RPH-CPP  ? 3 months ago Essential hypertension  ? [facility] Lois Huxley, Cornelius Moras, RPH-CPP  ? 4 months ago Essential hypertension  ? [facility] Lois Huxley, Cornelius Moras, RPH-CPP  ? 5 months ago Essential hypertension  ? [facility] Lois Huxley, Cornelius Moras, RPH-CPP  ?  ?  ?Future Appointments   ?        ? In 2 months Marcine Matar, MD [facility]  ?  ? ?  ?  ?  ? ? ?

## 2021-12-25 ENCOUNTER — Other Ambulatory Visit: Payer: Self-pay

## 2021-12-26 ENCOUNTER — Other Ambulatory Visit: Payer: Self-pay

## 2022-01-07 ENCOUNTER — Telehealth: Payer: Self-pay

## 2022-01-07 NOTE — Telephone Encounter (Signed)
Health Coaching 3 ? ?interpreter- Natale Lay, [employer]  ?  ?Goals- Patient has made changes in regards to diet. Patient has reduced the amount of carbs that she consumes, specifically rice and tortillas.  ?  ?New goal- Try and start doing some daily exercising.  ? ?Barrier to reaching goal-  ?  ?Strategies to overcome- Start with 2-3 days a week of exercise for 20 minutes and work up to daily.  ?  ?Navigation:  Patient is aware of  a follow up session. Patient is scheduled for Feb 23, 2022 @ 9:30 am ?  ?Time-  10 minutes ?

## 2022-01-23 ENCOUNTER — Other Ambulatory Visit: Payer: Self-pay

## 2022-01-29 ENCOUNTER — Other Ambulatory Visit: Payer: Self-pay | Admitting: Internal Medicine

## 2022-01-29 ENCOUNTER — Other Ambulatory Visit: Payer: Self-pay

## 2022-01-29 DIAGNOSIS — E1169 Type 2 diabetes mellitus with other specified complication: Secondary | ICD-10-CM

## 2022-01-30 ENCOUNTER — Other Ambulatory Visit: Payer: Self-pay

## 2022-01-30 MED ORDER — METFORMIN HCL 500 MG PO TABS
500.0000 mg | ORAL_TABLET | Freq: Every day | ORAL | 2 refills | Status: DC
  Filled 2022-01-30: qty 30, 30d supply, fill #0
  Filled 2022-03-05: qty 30, 30d supply, fill #1
  Filled 2022-04-07: qty 30, 30d supply, fill #2

## 2022-01-30 NOTE — Telephone Encounter (Signed)
Requested medication (s) are due for refill today: yes ? ?Requested medication (s) are on the active medication list: expired on 01/24/22 ? ?Last refill:  10/09/21 ? ?Future visit scheduled: yes ? ?Notes to clinic:  expired medication ? ? ?Requested Prescriptions  ?Pending Prescriptions Disp Refills  ? metFORMIN (GLUCOPHAGE) 500 MG tablet 30 tablet 2  ?  Sig: Take 1 tablet (500 mg total) by mouth daily with breakfast.  ?  ? Endocrinology:  Diabetes - Biguanides Failed - 01/30/2022  8:00 AM  ?  ?  Failed - B12 Level in normal range and within 720 days  ?  No results found for: VITAMINB12  ?  ?  ?  Failed - CBC within normal limits and completed in the last 12 months  ?  WBC  ?Date Value Ref Range Status  ?06/30/2021 10.1 3.4 - 10.8 x10E3/uL Final  ? ?RBC  ?Date Value Ref Range Status  ?06/30/2021 5.05 3.77 - 5.28 x10E6/uL Final  ? ?Hemoglobin  ?Date Value Ref Range Status  ?06/30/2021 15.2 11.1 - 15.9 g/dL Final  ? ?Hematocrit  ?Date Value Ref Range Status  ?06/30/2021 43.7 34.0 - 46.6 % Final  ? ?MCHC  ?Date Value Ref Range Status  ?06/30/2021 34.8 31.5 - 35.7 g/dL Final  ? ?MCH  ?Date Value Ref Range Status  ?06/30/2021 30.1 26.6 - 33.0 pg Final  ? ?MCV  ?Date Value Ref Range Status  ?06/30/2021 87 79 - 97 fL Final  ? ?No results found for: PLTCOUNTKUC, LABPLAT, [place] ?RDW  ?Date Value Ref Range Status  ?06/30/2021 13.1 11.7 - 15.4 % Final  ? ?  ?  ?  Passed - Cr in normal range and within 360 days  ?  Creatinine, Ser  ?Date Value Ref Range Status  ?10/21/2021 0.78 0.57 - 1.00 mg/dL Final  ?  ?  ?  ?  Passed - HBA1C is between 0 and 7.9 and within 180 days  ?  HbA1c, POC (controlled diabetic range)  ?Date Value Ref Range Status  ?11/03/2021 6.4 0.0 - 7.0 % Final  ?  ?  ?  ?  Passed - eGFR in normal range and within 360 days  ?  eGFR  ?Date Value Ref Range Status  ?10/21/2021 96 >59 mL/min/1.73 Final  ?  ?  ?  ?  Passed - Valid encounter within last 6 months  ?  Recent Outpatient Visits   ? ?      ? 2 months ago Type  2 diabetes mellitus with obesity ([place])  ? [place] Ladell Pier, MD  ? 3 months ago Essential hypertension  ? [place], RPH-CPP  ? 4 months ago Essential hypertension  ? [place], RPH-CPP  ? 5 months ago Essential hypertension  ? [place], RPH-CPP  ? 6 months ago Essential hypertension  ? [place], RPH-CPP  ? ?  ?  ?Future Appointments   ? ?        ? In 1 month Ladell Pier, MD [place]  ? ?  ? ?  ?  ?  ? ? ? ? ?

## 2022-02-02 ENCOUNTER — Other Ambulatory Visit: Payer: Self-pay

## 2022-02-23 ENCOUNTER — Other Ambulatory Visit: Payer: Self-pay

## 2022-02-23 ENCOUNTER — Inpatient Hospital Stay: Payer: Self-pay | Attending: Obstetrics and Gynecology | Admitting: *Deleted

## 2022-02-23 VITALS — BP 150/98 | Ht 63.0 in | Wt 182.9 lb

## 2022-02-23 DIAGNOSIS — Z0000 Encounter for general adult medical examination without abnormal findings: Secondary | ICD-10-CM

## 2022-02-23 NOTE — Progress Notes (Signed)
[facility] follow up ?  ?Interpreter: Rudene Anda, [employer] ? ?Clinical Measurement:  There were no vitals filed for this visit.  ?  ?Medical History:  Patient states that she has high cholesterol, has high blood pressure and she has diabetes. ? ?Medications:  Patient states that she does take medication to lower cholesterol, blood pressure and blood sugar.  Patient does not take an aspirin a day to help prevent a heart attack or stroke. During the past 7 days patient has taken prescribed medication to lower blood sugar, blood pressure and cholesterol on 7 days. ?  ?Blood pressure, self measurement: Patient states that she does measure blood pressure from home. She checks her blood pressure weekly. She shares her readings with a health care provider: yes. ?  ?Nutrition: Patient states that on average she eats 0 cups of fruit and 1 cups of vegetables per day. Patient states that she does not eat fish at least 2 times per week. Patient eats less than half servings of whole grains. Patient drinks less than 36 ounces of beverages with added sugar weekly: yes. Patient is currently watching sodium or salt intake: yes. In the past 7 days patient has had 1 drinks containing alcohol. On average patient drinks 2 drinks containing alcohol per day.     ? ?Physical activity:  Patient states that she gets 0 minutes of moderate and 0 minutes of vigorous physical activity each week. ? ?Smoking status:  Patient states that she has is a current smoker . ?  ?Quality of life:  Over the past 2 weeks patient states that she had little interest or pleasure in doing things: not at all. She has been feeling down, depressed or hopeless:not at all.  ?  ?Risk reduction and counseling:  ? ?Health Coaching:  Spoke with patient about continuing to practice a heart healthy diet. Spoke with patient about watching the amount of sweets and sugars that she consumes as well as carbs. Patient has been watching the amount of salt that she consumes.  Patient has not been exercising recently. Encouraged patient to try and start walking daily for at least 20-30 minutes. Patient is still smoking on some days. Spoke with patient about smoking cessation and the Quitline.  ?  ?Navigation: This was the  follow up session for this patient, I will check up on her progress in the coming months. Patient has FU appointment scheduled on 03/03/22 with PCP. Patient will FU with PCP then for elevated BP. ? ?Time: 25 minutes  ?

## 2022-02-25 ENCOUNTER — Other Ambulatory Visit: Payer: Self-pay

## 2022-03-02 ENCOUNTER — Other Ambulatory Visit: Payer: Self-pay

## 2022-03-03 ENCOUNTER — Ambulatory Visit: Payer: Self-pay | Admitting: Internal Medicine

## 2022-03-06 ENCOUNTER — Other Ambulatory Visit: Payer: Self-pay

## 2022-03-09 ENCOUNTER — Other Ambulatory Visit: Payer: Self-pay | Admitting: Pharmacist

## 2022-03-09 ENCOUNTER — Other Ambulatory Visit: Payer: Self-pay

## 2022-03-11 NOTE — Chronic Care Management (AMB) (Signed)
Patient seen by Leitha Schuller, PharmD Candidate on 03/09/22 while they were picking up prescriptions at [place] at [facility].   Patient has an automated home blood pressure machine, but does not recall what recent home readings were  Medication review was performed. They are taking medications as prescribed.   The following barriers to adherence were noted: - Denies concerns with medication access or understanding.  The following interventions were completed:  - Medications were reviewed - Patient was educated on medications, including indication and administration - Patient was educated on proper technique to check home blood pressure and reminded to bring home machine and readings to next provider appointment - Patient was counseled on lifestyle modifications to improve blood pressure - discussed reducing salt in diet - Patient was counseled on benefit of tobacco cessation - encouraged reducing tobacco use  The patient has follow up scheduled:  PCP: 04/13/22   Catie Hedwig Morton, PharmD, [place] [phone]

## 2022-03-26 ENCOUNTER — Other Ambulatory Visit: Payer: Self-pay

## 2022-04-07 ENCOUNTER — Other Ambulatory Visit: Payer: Self-pay

## 2022-04-07 ENCOUNTER — Other Ambulatory Visit: Payer: Self-pay | Admitting: Internal Medicine

## 2022-04-07 MED ORDER — LOSARTAN POTASSIUM 50 MG PO TABS
50.0000 mg | ORAL_TABLET | Freq: Every day | ORAL | 0 refills | Status: DC
  Filled 2022-04-07: qty 30, 30d supply, fill #0

## 2022-04-08 ENCOUNTER — Other Ambulatory Visit: Payer: Self-pay

## 2022-04-09 ENCOUNTER — Other Ambulatory Visit: Payer: Self-pay

## 2022-04-13 ENCOUNTER — Ambulatory Visit: Payer: Self-pay | Attending: Internal Medicine | Admitting: Internal Medicine

## 2022-04-13 ENCOUNTER — Encounter: Payer: Self-pay | Admitting: Internal Medicine

## 2022-04-13 VITALS — BP 130/65 | HR 90 | Temp 98.6°F | Resp 16 | Wt 182.0 lb

## 2022-04-13 DIAGNOSIS — I152 Hypertension secondary to endocrine disorders: Secondary | ICD-10-CM

## 2022-04-13 DIAGNOSIS — E669 Obesity, unspecified: Secondary | ICD-10-CM

## 2022-04-13 DIAGNOSIS — E1169 Type 2 diabetes mellitus with other specified complication: Secondary | ICD-10-CM

## 2022-04-13 DIAGNOSIS — F17200 Nicotine dependence, unspecified, uncomplicated: Secondary | ICD-10-CM

## 2022-04-13 DIAGNOSIS — E785 Hyperlipidemia, unspecified: Secondary | ICD-10-CM

## 2022-04-13 DIAGNOSIS — E1159 Type 2 diabetes mellitus with other circulatory complications: Secondary | ICD-10-CM

## 2022-04-13 LAB — POCT GLYCOSYLATED HEMOGLOBIN (HGB A1C): HbA1c, POC (prediabetic range): 6 % (ref 5.7–6.4)

## 2022-04-13 LAB — GLUCOSE, POCT (MANUAL RESULT ENTRY): POC Glucose: 114 mg/dL — AB (ref 70–99)

## 2022-04-13 NOTE — Progress Notes (Signed)
Patient ID: Jessica Hancock, female    DOB: Nov 07, 1975  MRN: [id number]  CC: Diabetes   Subjective: Jessica Hancock is a 46 y.o. female who presents for chronic ds management Her concerns today include:  Pt with hx of DM, HL, HTN, obesity, tob dep, ETOH use disorder.    DM: Results for orders placed or performed in visit on 04/13/22  HgB A1c  Result Value Ref Range   Hemoglobin A1C     HbA1c POC (<> result, manual entry)     HbA1c, POC (prediabetic range) 6.0 5.7 - 6.4 %   HbA1c, POC (controlled diabetic range)    Glucose (CBG)  Result Value Ref Range   POC Glucose 114 (A) 70 - 99 mg/dl  Last A1C was 6.4 Reports compliance with Metformin 500 mg daily Checks BS 2-3 times a day.  Reports low episode with BS of 70, she felt shaky, Has only occurred 2 times this yr. Doing well with eating habits; eating more lean meat. Walks 15-20 mins several times a wk depending on weather.  Not able to afford  eye exam as yet Taking and tolerating Cozaar 50 mg and took already today.  Has device and checks BP about once a wk.  128/84.  This a.m was 128/85. Limits salt in foods Taking and tolerating Lipitor. LFTs nl on last vsiit Still smoking cigars but reports she continues to try to cut back.  Smokes 1 cigar 3 x a wk.    Patient Active Problem List   Diagnosis Date Noted   Diabetes Mellitus, Type 2 06/17/2021   Hyperlipidemia associated with type 2 diabetes mellitus ([place]) 06/17/2021   Obesity 06/17/2021   Tobacco use 06/17/2021   Alcohol consumption heavy 06/17/2021     Current Outpatient Medications on File Prior to Visit  Medication Sig Dispense Refill   atorvastatin (LIPITOR) 10 MG tablet Take 1 tablet (10 mg total) by mouth daily. 30 tablet 5   Blood Glucose Monitoring Suppl (TRUE METRIX METER) w/Device KIT Use as directed 1 kit 0   glucose blood (TRUE METRIX BLOOD GLUCOSE TEST) test strip Use as instructed 100 each 12   losartan (COZAAR) 50 MG tablet Take  1 tablet (50 mg total) by mouth daily. 30 tablet 0   metFORMIN (GLUCOPHAGE) 500 MG tablet Take 1 tablet (500 mg total) by mouth daily with breakfast. 30 tablet 2   nicotine (NICODERM CQ - DOSED IN MG/24 HOURS) 14 mg/24hr patch Place 1 patch (14 mg total) onto the skin daily. 28 patch 1   TRUEplus Lancets 28G MISC Use as directed 100 each 4   No current facility-administered medications on file prior to visit.    No Known Allergies  Social History   Socioeconomic History   Marital status: Married    Spouse name: Not on file   Number of children: 1   Years of education: Not on file   Highest education level: Associate degree: occupational, Hotel manager, or vocational program  Occupational History   Not on file  Tobacco Use   Smoking status: Every Day    Packs/day: 0.10    Years: 15.00    Total pack years: 1.50    Types: Cigarettes   Smokeless tobacco: Never   Tobacco comments:    Not every day 02/23/22  Vaping Use   Vaping Use: Never used  Substance and Sexual Activity   Alcohol use: Yes    Comment: occasionally   Drug use: Never   Sexual activity:  Yes    Birth control/protection: None  Other Topics Concern   Not on file  Social History Narrative   Not on file   Social Determinants of Health   Financial Resource Strain: Not on file  Food Insecurity: No Food Insecurity (02/23/2022)   Hunger Vital Sign    Worried About Running Out of Food in the Last Year: Never true    Ran Out of Food in the Last Year: Never true  Transportation Needs: No Transportation Needs (02/23/2022)   PRAPARE - Hydrologist (Medical): No    Lack of Transportation (Non-Medical): No  Physical Activity: Not on file  Stress: Not on file  Social Connections: Not on file  Intimate Partner Violence: Not on file    Family History  Problem Relation Age of Onset   Diabetes Mother    Breast cancer Mother    Hypertension Father     History reviewed. No pertinent surgical  history.  ROS: Review of Systems Negative except as stated above  PHYSICAL EXAM: BP 130/65   Pulse 90   Temp 98.6 F (37 C)   Resp 16   Wt 182 lb (82.6 kg)   LMP 03/31/2022 (Approximate)   SpO2 98%   BMI 32.24 kg/m   Wt Readings from Last 3 Encounters:  04/13/22 182 lb (82.6 kg)  02/23/22 182 lb 14.4 oz (83 kg)  11/03/21 183 lb 6.4 oz (83.2 kg)    Physical Exam  General appearance - alert, well appearing, and in no distress Mental status - normal mood, behavior, speech, dress, motor activity, and thought processes Neck - supple, no significant adenopathy Chest - clear to auscultation, no wheezes, rales or rhonchi, symmetric air entry Heart - normal rate, regular rhythm, normal S1, S2, no murmurs, rubs, clicks or gallops Extremities - peripheral pulses normal, no pedal edema, no clubbing or cyanosis Diabetic Foot Exam - Simple   Simple Foot Form Diabetic Foot exam was performed with the following findings: Yes 04/13/2022  3:04 PM  Visual Inspection No deformities, no ulcerations, no other skin breakdown bilaterally: Yes Sensation Testing Intact to touch and monofilament testing bilaterally: Yes Pulse Check Posterior Tibialis and Dorsalis pulse intact bilaterally: Yes Comments         Latest Ref Rng & Units 12/04/2021    9:21 AM 10/21/2021   10:08 AM 07/22/2021   10:43 AM  CMP  Glucose 70 - 99 mg/dL  126  216   BUN 6 - 24 mg/dL  11  12   Creatinine 0.57 - 1.00 mg/dL  0.78  0.75   Sodium 134 - 144 mmol/L  139  134   Potassium 3.5 - 5.2 mmol/L  4.5  4.3   Chloride 96 - 106 mmol/L  101  98   CO2 20 - 29 mmol/L  17  20   Calcium 8.7 - 10.2 mg/dL  9.4  9.7   Total Protein 6.0 - 8.5 g/dL 6.6  7.0    Total Bilirubin 0.0 - 1.2 mg/dL 0.7  0.6    Alkaline Phos 44 - 121 IU/L 62  75    AST 0 - 40 IU/L 15  23    ALT 0 - 32 IU/L 10  18     Lipid Panel     Component Value Date/Time   CHOL 213 (H) 05/21/2021 1004   TRIG 96 05/21/2021 1004   HDL 61 05/21/2021 1004    CHOLHDL 3.5 05/21/2021 1004   LDLCALC 135 (  H) 05/21/2021 1004    CBC    Component Value Date/Time   WBC 10.1 06/30/2021 1212   RBC 5.05 06/30/2021 1212   HGB 15.2 06/30/2021 1212   HCT 43.7 06/30/2021 1212   PLT 280 06/30/2021 1212   MCV 87 06/30/2021 1212   MCH 30.1 06/30/2021 1212   MCHC 34.8 06/30/2021 1212   RDW 13.1 06/30/2021 1212    ASSESSMENT AND PLAN: 1. Type 2 diabetes mellitus with obesity ([place]) Commended her on A1c being at goal.  Encouraged her to continue healthy eating habits and regular exercise.  Continue metformin.  Try to get eye exam and she can afford. - HgB A1c - Glucose (CBG)  2. Hyperlipidemia associated with type 2 diabetes mellitus (HCC) Continue atorvastatin.  Plan to recheck cholesterol on subsequent visit.  3. Hypertension associated with diabetes ([place]) At goal.  Continue Cozaar 50 mg daily  4. Tobacco dependence Continue to advise smoking cessation due to health risks associated with smoking.  Patient not ready to quit.    [employer] Language interpreter used during this encounter. #[id number], Santiago Glad  Patient was given the opportunity to ask questions.  Patient verbalized understanding of the plan and was able to repeat key elements of the plan.   This documentation was completed using Radio producer.  Any transcriptional errors are unintentional.  Orders Placed This Encounter  Procedures   HgB A1c   Glucose (CBG)     Requested Prescriptions    No prescriptions requested or ordered in this encounter    Return in about 4 months (around 08/13/2022).  Karle Plumber, MD, FACP

## 2022-04-13 NOTE — Progress Notes (Signed)
F/u DM 

## 2022-04-22 ENCOUNTER — Other Ambulatory Visit: Payer: Self-pay

## 2022-05-07 ENCOUNTER — Other Ambulatory Visit: Payer: Self-pay | Admitting: Internal Medicine

## 2022-05-07 ENCOUNTER — Other Ambulatory Visit: Payer: Self-pay

## 2022-05-07 DIAGNOSIS — E1169 Type 2 diabetes mellitus with other specified complication: Secondary | ICD-10-CM

## 2022-05-07 MED ORDER — METFORMIN HCL 500 MG PO TABS
500.0000 mg | ORAL_TABLET | Freq: Every day | ORAL | 2 refills | Status: DC
  Filled 2022-05-07: qty 30, 30d supply, fill #0
  Filled 2022-06-09: qty 30, 30d supply, fill #1
  Filled 2022-07-08: qty 30, 30d supply, fill #2

## 2022-05-07 MED ORDER — LOSARTAN POTASSIUM 50 MG PO TABS
50.0000 mg | ORAL_TABLET | Freq: Every day | ORAL | 2 refills | Status: DC
  Filled 2022-05-07: qty 30, 30d supply, fill #0
  Filled 2022-06-09: qty 30, 30d supply, fill #1
  Filled 2022-07-08: qty 30, 30d supply, fill #2

## 2022-05-08 ENCOUNTER — Other Ambulatory Visit: Payer: Self-pay

## 2022-05-11 ENCOUNTER — Other Ambulatory Visit: Payer: Self-pay

## 2022-06-08 ENCOUNTER — Other Ambulatory Visit: Payer: Self-pay

## 2022-06-08 DIAGNOSIS — Z1231 Encounter for screening mammogram for malignant neoplasm of breast: Secondary | ICD-10-CM

## 2022-06-09 ENCOUNTER — Other Ambulatory Visit: Payer: Self-pay

## 2022-06-12 ENCOUNTER — Other Ambulatory Visit: Payer: Self-pay

## 2022-06-24 ENCOUNTER — Inpatient Hospital Stay: Payer: Self-pay | Attending: Obstetrics and Gynecology | Admitting: *Deleted

## 2022-06-24 ENCOUNTER — Other Ambulatory Visit: Payer: Self-pay

## 2022-06-24 VITALS — BP 140/90 | Ht 63.0 in | Wt 184.1 lb

## 2022-06-24 DIAGNOSIS — Z0000 Encounter for general adult medical examination without abnormal findings: Secondary | ICD-10-CM

## 2022-06-24 NOTE — Progress Notes (Signed)
[facility] initial screening   Interpreter- Natale Lay, Haroldine Laws   Clinical Measurement:  Vitals:   06/24/22 0911  BP: 132/88   Fasting Labs Drawn Today, will review with patient when they result.   Medical History:  Patient states that she has high cholesterol, has high blood pressure and she has diabetes.  Medications:  Patient states that she does take medication to lower cholesterol, blood pressure and blood sugar.  Patient does not take an aspirin a day to help prevent a heart attack or stroke. During the past 7 days patient has taken prescribed medication to lower cholesterol, blood pressure and blood sugar on 7 days.   Blood pressure, self measurement: Patient states that she does measure blood pressure from home. She checks her blood pressure a few times per week. She shares her readings with a health care provider: yes.   Nutrition: Patient states that on average she eats 0 cups of fruit and 1 cups of vegetables per day. Patient states that she does not eat fish at least 2 times per week. Patient eats less than half servings of whole grains. Patient drinks less than 36 ounces of beverages with added sugar weekly: yes. Patient is currently watching sodium or salt intake: yes. In the past 7 days patient has consumed drinks containing alcohol on 1 days. On a day that patient consumes drinks containing alcohol on average 4 drinks are consumed.      Physical activity:  Patient states that she gets 40 minutes of moderate and 0 minutes of vigorous physical activity each week.  Smoking status:  Patient states that she has is a current smoker .   Quality of life:  Over the past 2 weeks patient states that she had little interest or pleasure in doing things: not at all. She has been feeling down, depressed or hopeless:not at all.    Risk reduction and counseling:   Health Coaching: Patient declined referral to [facility] Quitline. Spoke with patient about the daily recommendations for fruits and  vegetables ( 2 cups of fruit and 3 cups of vegetables). Explained to patient about the benefits of adding heart healthy fish into diet. Gave examples of heart healthy fish that patient can try (salmon, tuna, mackerel, sardines, sea bass or trout). Patient does not consume whole grains regularly. Gave suggestions for whole grain breads, whole grain cereals, brown rice, oatmeal and whole wheat pasta. Patient has been watching the amount of salt that she consumes. Patient states that she does not drink beverages with added sugars. Patient has been walking 2 days a week for 15-30 minutes. Encouraged patient to try and exercise daily for 20-25 minutes.   Goal: Diet changes. Patient would like to practice a more healthy diet. Patient will increase fruit and vegetable intake to 1 serving each day. Patient will work on making this change over the next month.      Navigation:  I will notify patient of lab results.  Patient is aware of 2 more health coaching sessions and a follow up.  Time: 25 minutes

## 2022-06-25 LAB — LIPID PANEL
Chol/HDL Ratio: 2 ratio (ref 0.0–4.4)
Cholesterol, Total: 163 mg/dL (ref 100–199)
HDL: 80 mg/dL (ref >39)
LDL Chol Calc (NIH): 69 mg/dL (ref 0–99)
Triglycerides: 76 mg/dL (ref 0–149)
VLDL Cholesterol Cal: 14 mg/dL (ref 5–40)

## 2022-06-25 LAB — HEMOGLOBIN A1C
Est. average glucose Bld gHb Est-mCnc: 131 mg/dL
Hgb A1c MFr Bld: 6.2 % — ABNORMAL HIGH (ref 4.8–5.6)

## 2022-06-25 LAB — GLUCOSE, RANDOM: Glucose: 235 mg/dL — ABNORMAL HIGH (ref 70–99)

## 2022-06-29 ENCOUNTER — Telehealth: Payer: Self-pay

## 2022-06-29 NOTE — Telephone Encounter (Addendum)
Health coaching 2   interpreter- Natale Lay, [employer]   Labs- 163 cholesterol, 69 LDL cholesterol, 76 triglycerides, 80 HDL cholesterol, 6.2 hemoglobin A1C, 235 mean plasma glucose. Patient understands and is aware of her lab results.   Goals-  1. Continue watching the amount of sweets and sugars consumed in both food and drinks. 2. Continue watching the amount of carbs consumed. 3. Continue taking blood sugar lowering medications as prescribed by her PCP. 4. Continue trying to add in more daily exercise with a goal of 20-30 minutes daily.   Navigation:  Patient is aware of 1 more health coaching sessions and a follow up. Patient is scheduled to follow-up with her PCP at [facility] on 08/14/22.  Patient informed me during her result call that during her lab visit she had to drink a sugary drink and wait 1 hour to have her blood drawn. Based on what the patient described I assume that a glucose tolerance test was performed. Informed patient that we would need to bring her back in to have her fasting blood glucose re-drawn. Patient voiced understanding and is scheduled to come back on 06/30/22.   Time-  15 minutes

## 2022-06-30 ENCOUNTER — Other Ambulatory Visit: Payer: Self-pay

## 2022-06-30 DIAGNOSIS — Z0000 Encounter for general adult medical examination without abnormal findings: Secondary | ICD-10-CM

## 2022-07-01 LAB — GLUCOSE, RANDOM: Glucose: 116 mg/dL — ABNORMAL HIGH (ref 70–99)

## 2022-07-06 ENCOUNTER — Telehealth: Payer: Self-pay

## 2022-07-06 NOTE — Telephone Encounter (Signed)
Called patient via Rudene Anda, [employer] to give repeat glucose results. Informed patient that glucose was 116. Patient voiced understanding.

## 2022-07-08 ENCOUNTER — Other Ambulatory Visit: Payer: Self-pay

## 2022-07-10 ENCOUNTER — Other Ambulatory Visit: Payer: Self-pay

## 2022-07-17 ENCOUNTER — Other Ambulatory Visit: Payer: Self-pay

## 2022-08-11 ENCOUNTER — Other Ambulatory Visit: Payer: Self-pay

## 2022-08-11 ENCOUNTER — Other Ambulatory Visit: Payer: Self-pay | Admitting: Internal Medicine

## 2022-08-11 DIAGNOSIS — E1169 Type 2 diabetes mellitus with other specified complication: Secondary | ICD-10-CM

## 2022-08-11 MED ORDER — METFORMIN HCL 500 MG PO TABS
500.0000 mg | ORAL_TABLET | Freq: Every day | ORAL | 0 refills | Status: DC
  Filled 2022-08-11: qty 30, 30d supply, fill #0

## 2022-08-11 MED ORDER — ATORVASTATIN CALCIUM 10 MG PO TABS
10.0000 mg | ORAL_TABLET | Freq: Every day | ORAL | 0 refills | Status: DC
  Filled 2022-08-11: qty 30, 30d supply, fill #0

## 2022-08-11 MED ORDER — LOSARTAN POTASSIUM 50 MG PO TABS
50.0000 mg | ORAL_TABLET | Freq: Every day | ORAL | 0 refills | Status: DC
  Filled 2022-08-11: qty 30, 30d supply, fill #0

## 2022-08-14 ENCOUNTER — Encounter: Payer: Self-pay | Admitting: Internal Medicine

## 2022-08-14 ENCOUNTER — Other Ambulatory Visit: Payer: Self-pay

## 2022-08-14 ENCOUNTER — Other Ambulatory Visit: Payer: Self-pay | Admitting: Pharmacist

## 2022-08-14 ENCOUNTER — Ambulatory Visit: Payer: Self-pay | Attending: Internal Medicine | Admitting: Internal Medicine

## 2022-08-14 VITALS — BP 130/90 | HR 76 | Wt 189.4 lb

## 2022-08-14 DIAGNOSIS — F17200 Nicotine dependence, unspecified, uncomplicated: Secondary | ICD-10-CM

## 2022-08-14 DIAGNOSIS — E785 Hyperlipidemia, unspecified: Secondary | ICD-10-CM

## 2022-08-14 DIAGNOSIS — Z1211 Encounter for screening for malignant neoplasm of colon: Secondary | ICD-10-CM

## 2022-08-14 DIAGNOSIS — E1169 Type 2 diabetes mellitus with other specified complication: Secondary | ICD-10-CM

## 2022-08-14 DIAGNOSIS — Z23 Encounter for immunization: Secondary | ICD-10-CM

## 2022-08-14 DIAGNOSIS — I152 Hypertension secondary to endocrine disorders: Secondary | ICD-10-CM

## 2022-08-14 DIAGNOSIS — E1159 Type 2 diabetes mellitus with other circulatory complications: Secondary | ICD-10-CM

## 2022-08-14 DIAGNOSIS — E669 Obesity, unspecified: Secondary | ICD-10-CM

## 2022-08-14 MED ORDER — VARENICLINE TARTRATE 0.5 MG PO TABS
ORAL_TABLET | ORAL | 0 refills | Status: DC
  Filled 2022-08-14: qty 11, 7d supply, fill #0

## 2022-08-14 MED ORDER — ATORVASTATIN CALCIUM 10 MG PO TABS
10.0000 mg | ORAL_TABLET | Freq: Every day | ORAL | 2 refills | Status: DC
  Filled 2022-09-14: qty 90, 90d supply, fill #0
  Filled 2022-12-15: qty 90, 90d supply, fill #1
  Filled 2023-03-19: qty 90, 90d supply, fill #2

## 2022-08-14 MED ORDER — VARENICLINE TARTRATE 1 MG PO TABS
1.0000 mg | ORAL_TABLET | Freq: Two times a day (BID) | ORAL | 1 refills | Status: DC
  Filled 2022-08-14: qty 60, 30d supply, fill #0

## 2022-08-14 MED ORDER — LOSARTAN POTASSIUM 50 MG PO TABS
50.0000 mg | ORAL_TABLET | Freq: Every day | ORAL | 2 refills | Status: DC
  Filled 2022-09-14: qty 90, 90d supply, fill #0
  Filled 2022-12-15: qty 90, 90d supply, fill #1
  Filled 2023-03-19: qty 90, 90d supply, fill #2

## 2022-08-14 MED ORDER — METFORMIN HCL 500 MG PO TABS
500.0000 mg | ORAL_TABLET | Freq: Every day | ORAL | 2 refills | Status: DC
  Filled 2022-09-14: qty 90, 90d supply, fill #0
  Filled 2022-12-15: qty 90, 90d supply, fill #1
  Filled 2023-03-19: qty 90, 90d supply, fill #2

## 2022-08-14 MED ORDER — VARENICLINE TARTRATE (STARTER) 0.5 MG X 11 & 1 MG X 42 PO TBPK
ORAL_TABLET | ORAL | 0 refills | Status: DC
  Filled 2022-08-14: qty 53, fill #0

## 2022-08-14 NOTE — Progress Notes (Signed)
Patient ID: Jessica Hancock, female    DOB: January 27, 1976  MRN: [id number]  CC: Medication Refill   Subjective: Jessica Hancock is a 46 y.o. female who presents for chronic ds management Her concerns today include:  Pt with hx of DM, HL, HTN, obesity, tob dep, ETOH use disorder.     DM: Lab Results  Component Value Date   HGBA1C 6.2 (H) 06/24/2022  Reports compliance with metformin 500 mg daily. Checks BS daily; gives range 101-130 Wgh up 7 lbs since last visit 4 mths ago.  Made a lot of dietary changes:  drinking more water, cut back on tortias, eating more fruits. Not getting in much exercise, walks 2-3 days a wk but not consistently No DM eye exam as yet.  No insurance  HTN: Reports compliance with Cozaar 50 mg daily.  -did not take as yet today; ran out 2 days ago  Limit salt in the foods.  No chest pains or shortness of breath. HL: Reports compliance with atorvastatin.  Last LDL was 64.  Tob dep:  still smoking 2-3 cigars/wk.  Tried patches in past to help quit but made her nauseated.    Patient Active Problem List   Diagnosis Date Noted   Diabetes Mellitus, Type 2 06/17/2021   Hyperlipidemia associated with type 2 diabetes mellitus ([place]) 06/17/2021   Obesity 06/17/2021   Tobacco use 06/17/2021   Alcohol consumption heavy 06/17/2021     Current Outpatient Medications on File Prior to Visit  Medication Sig Dispense Refill   Blood Glucose Monitoring Suppl (TRUE METRIX METER) w/Device KIT Use as directed 1 kit 0   glucose blood (TRUE METRIX BLOOD GLUCOSE TEST) test strip Use as instructed 100 each 12   TRUEplus Lancets 28G MISC Use as directed 100 each 4   No current facility-administered medications on file prior to visit.    No Known Allergies  Social History   Socioeconomic History   Marital status: Married    Spouse name: Not on file   Number of children: 1   Years of education: Not on file   Highest education level: Associate  degree: occupational, Hotel manager, or vocational program  Occupational History   Not on file  Tobacco Use   Smoking status: Every Day    Packs/day: 0.10    Years: 15.00    Total pack years: 1.50    Types: Cigarettes   Smokeless tobacco: Never   Tobacco comments:    Not every day 02/23/22  Vaping Use   Vaping Use: Never used  Substance and Sexual Activity   Alcohol use: Yes    Comment: occasionally   Drug use: Never   Sexual activity: Yes    Birth control/protection: None  Other Topics Concern   Not on file  Social History Narrative   ** Merged History Encounter **       Social Determinants of Health   Financial Resource Strain: Not on file  Food Insecurity: No Food Insecurity (06/24/2022)   Hunger Vital Sign    Worried About Running Out of Food in the Last Year: Never true    Ran Out of Food in the Last Year: Never true  Transportation Needs: No Transportation Needs (06/24/2022)   PRAPARE - Hydrologist (Medical): No    Lack of Transportation (Non-Medical): No  Physical Activity: Not on file  Stress: Not on file  Social Connections: Not on file  Intimate Partner Violence: Not on file  Family History  Problem Relation Age of Onset   Diabetes Mother    Breast cancer Mother    Hypertension Father     History reviewed. No pertinent surgical history.  ROS: Review of Systems Negative except as stated above  PHYSICAL EXAM: BP (!) 130/90   Pulse 76   Wt 189 lb 6.4 oz (85.9 kg)   SpO2 96%   BMI 33.55 kg/m   Wt Readings from Last 3 Encounters:  08/14/22 189 lb 6.4 oz (85.9 kg)  06/24/22 184 lb 1.6 oz (83.5 kg)  04/13/22 182 lb (82.6 kg)    Physical Exam  General appearance - alert, well appearing, middle age 25 female and in no distress Mental status - normal mood, behavior, speech, dress, motor activity, and thought processes Neck - supple, no significant adenopathy Chest - clear to auscultation, no wheezes, rales or  rhonchi, symmetric air entry Heart - normal rate, regular rhythm, normal S1, S2, no murmurs, rubs, clicks or gallops Extremities - peripheral pulses normal, no pedal edema, no clubbing or cyanosis      Latest Ref Rng & Units 06/30/2022    9:11 AM 06/24/2022   10:57 AM 12/04/2021    9:21 AM  CMP  Glucose 70 - 99 mg/dL 116  235    Total Protein 6.0 - 8.5 g/dL   6.6   Total Bilirubin 0.0 - 1.2 mg/dL   0.7   Alkaline Phos 44 - 121 IU/L   62   AST 0 - 40 IU/L   15   ALT 0 - 32 IU/L   10    Lipid Panel     Component Value Date/Time   CHOL 163 06/24/2022 1057   TRIG 76 06/24/2022 1057   HDL 80 06/24/2022 1057   CHOLHDL 2.0 06/24/2022 1057   LDLCALC 69 06/24/2022 1057    CBC    Component Value Date/Time   WBC 10.1 06/30/2021 1212   RBC 5.05 06/30/2021 1212   HGB 15.2 06/30/2021 1212   HCT 43.7 06/30/2021 1212   PLT 280 06/30/2021 1212   MCV 87 06/30/2021 1212   MCH 30.1 06/30/2021 1212   MCHC 34.8 06/30/2021 1212   RDW 13.1 06/30/2021 1212    ASSESSMENT AND PLAN: 1. Type 2 diabetes mellitus with obesity (HCC) At goal.  Continue metformin.  Commended her on dietary changes that she has made so far.  Further dietary counseling given.  Encouraged her to increase exercise to at least 4 to 5 days a week for 30 minutes moderate intensity exercise. - Microalbumin / creatinine urine ratio - metFORMIN (GLUCOPHAGE) 500 MG tablet; Take 1 tablet (500 mg total) by mouth daily with breakfast.  Dispense: 90 tablet; Refill: 2  2. Hypertension associated with diabetes ([place]) Diastolic blood pressure not at goal.  Patient has been out of Cozaar for 2 days.  Refill sent to the pharmacy. - losartan (COZAAR) 50 MG tablet; Take 1 tablet (50 mg total) by mouth daily.  Dispense: 90 tablet; Refill: 2  3. Hyperlipidemia associated with type 2 diabetes mellitus (HCC) Recent LDL at goal.  Continue atorvastatin - atorvastatin (LIPITOR) 10 MG tablet; Take 1 tablet (10 mg total) by mouth daily.  Dispense:  90 tablet; Refill: 2  4. Tobacco dependence Pt is current smoker. Patient advised to quit smoking. Discussed health risks associated with smoking including lung and other types of cancers, chronic lung diseases and CV risks.. Pt ready to give trail of quitting.   Discussed methods to help quit  including quitting cold [place], use of NRT, Chantix and Bupropion.  Pt wanting to try: Chantix.  We will start her on the starter pk _3_ Minutes spent on counseling. F/U:    - Varenicline Tartrate, Starter, (CHANTIX STARTING MONTH PAK) 0.5 MG X 11 & 1 MG X 42 TBPK; Use as instructed on package.  Dispense: 53 each; Refill: 0  5. Need for influenza vaccination This was to be given today but my CMA forgot to give it to her.  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  6. Screening for colon cancer - Fecal occult blood, imunochemical([facility])    [employer] interpreters used during this encounter. #[id number], [place]  Patient was given the opportunity to ask questions.  Patient verbalized understanding of the plan and was able to repeat key elements of the plan.   This documentation was completed using Radio producer.  Any transcriptional errors are unintentional.  Orders Placed This Encounter  Procedures   Fecal occult blood, imunochemical([facility])   Microalbumin / creatinine urine ratio     Requested Prescriptions   Signed Prescriptions Disp Refills   atorvastatin (LIPITOR) 10 MG tablet 90 tablet 2    Sig: Take 1 tablet (10 mg total) by mouth daily.   losartan (COZAAR) 50 MG tablet 90 tablet 2    Sig: Take 1 tablet (50 mg total) by mouth daily.   metFORMIN (GLUCOPHAGE) 500 MG tablet 90 tablet 2    Sig: Take 1 tablet (500 mg total) by mouth daily with breakfast.    Return in about 4 months (around 12/15/2022).  Karle Plumber, MD, FACP

## 2022-08-16 LAB — MICROALBUMIN / CREATININE URINE RATIO
Creatinine, Urine: 52.4 mg/dL
Microalb/Creat Ratio: 13 mg/g{creat} (ref 0–29)
Microalbumin, Urine: 6.9 ug/mL

## 2022-08-18 ENCOUNTER — Ambulatory Visit: Payer: Self-pay | Admitting: *Deleted

## 2022-08-18 ENCOUNTER — Ambulatory Visit: Payer: Self-pay

## 2022-08-18 ENCOUNTER — Other Ambulatory Visit (HOSPITAL_COMMUNITY)
Admission: RE | Admit: 2022-08-18 | Discharge: 2022-08-18 | Disposition: A | Discharge status: Home / Self Care | Payer: Self-pay | Source: Ambulatory Visit | Attending: Obstetrics and Gynecology | Admitting: Obstetrics and Gynecology

## 2022-08-18 ENCOUNTER — Other Ambulatory Visit: Payer: Self-pay

## 2022-08-18 VITALS — BP 130/82 | Wt 187.9 lb

## 2022-08-18 DIAGNOSIS — N6452 Nipple discharge: Secondary | ICD-10-CM

## 2022-08-18 DIAGNOSIS — Z1211 Encounter for screening for malignant neoplasm of colon: Secondary | ICD-10-CM

## 2022-08-18 DIAGNOSIS — Z1239 Encounter for other screening for malignant neoplasm of breast: Secondary | ICD-10-CM

## 2022-08-18 NOTE — Patient Instructions (Signed)
Explained breast self awareness with Jessica Hancock. Patient did not need a Pap smear today due to last Pap smear and HPV typing was 04/18/2020. Let her know [facility] will cover Pap smears and HPV typing every 5 years unless has a history of abnormal Pap smears. Referred patient to the [facility] for a diagnostic mammogram. Appointment scheduled Thursday, August 27, 2022 at [facility] [place]. Patient aware of appointment and will be there. Let patient know will follow up with her within the next couple weeks with results of breast discharge by phone. Jessica Hancock verbalized understanding.  [name], Arvil [place], RN 9:42 AM

## 2022-08-18 NOTE — Progress Notes (Signed)
Jessica Hancock is a 46 y.o. female who presents to [facility] clinic today with no complaints.    Pap Smear: Pap smear not completed today. Last Pap smear was 04/18/2020 at [facility] clinic and was normal with negative HPV. Per patient has no history of an abnormal Pap smear. Last Pap smear result is available in Epic.   Physical exam: Breasts Breasts symmetrical. No skin abnormalities bilateral breasts. Bilateral nipple retraction that is greater within the left breast that per patient is normal for her. No nipple discharge left breast. Expressed a clear nipple discharge from the right breast on exam. Sample of breast discharge sent to cytology for evaluation. No lymphadenopathy. No lumps palpated bilateral breasts. No complaints of pain or tenderness on exam.  Patient has two dark colors moles that have irregular border and a lighter color brown surrounding mole. Advised patient to follow-up with her PCP to have them checked.  MS DIGITAL DIAG TOMO BILAT  Result Date: 05/29/2021 CLINICAL DATA:  46 year old female presenting for evaluation of a palpable lump in the left breast. EXAM: DIGITAL DIAGNOSTIC BILATERAL MAMMOGRAM WITH TOMOSYNTHESIS AND CAD; ULTRASOUND LEFT BREAST LIMITED TECHNIQUE: Bilateral digital diagnostic mammography and breast tomosynthesis was performed. The images were evaluated with computer-aided detection.; Targeted ultrasound examination of the left breast was performed. COMPARISON:  Previous exam(s). ACR Breast Density Category c: The breast tissue is heterogeneously dense, which may obscure small masses. FINDINGS: A BB has been placed over the retroareolar left breast indicating the palpable site of concern. There are 2 obscured oval masses deep to the palpable marker. No other suspicious calcifications, masses or areas of distortion are seen in the bilateral breasts. Ultrasound targeted to the retroareolar left breast demonstrates 2 adjacent anechoic oval circumscribed  masses at 12 o'clock and 1 o'clock. The cyst at 12 o'clock measures 2.7 x 2.2 x 2.5 cm. The cyst at 1 o'clock measures 1.6 x 1.0 x 1.3 cm. IMPRESSION: 1. The palpable area in the retroareolar left breast corresponds with 2 adjacent benign cysts. 2.  No mammographic evidence of malignancy in the bilateral breasts. RECOMMENDATION: 1.  Screening mammogram in one year.(Code:[id number]-01Y) I have discussed the findings and recommendations with the patient. If applicable, a reminder letter will be sent to the patient regarding the next appointment. BI-RADS CATEGORY  2: Benign. Electronically Signed   By: Ammie Ferrier M.D.   On: 05/29/2021 10:21   MS DIGITAL SCREENING TOMO BILATERAL  Result Date: 04/19/2020 CLINICAL DATA:  Screening. EXAM: DIGITAL SCREENING BILATERAL MAMMOGRAM WITH TOMO AND CAD COMPARISON:  None. ACR Breast Density Category c: The breast tissue is heterogeneously dense, which may obscure small masses. FINDINGS: In the left breast, a possible 12 o'clock retroareolar mass warrants further evaluation. In the right breast, no findings suspicious for malignancy. Images were processed with CAD. IMPRESSION: Further evaluation is suggested for possible mass in the left breast. RECOMMENDATION: Ultrasound of the left breast. (Code:[id number]) The patient will be contacted regarding the findings, and additional imaging will be scheduled. BI-RADS CATEGORY  0: Incomplete. Need additional imaging evaluation and/or prior mammograms for comparison. Electronically Signed   By: Claudie Revering M.D.   On: 04/19/2020 15:22    Pelvic/Bimanual Pap is not indicated today per BCCCP guidelines.   Smoking History: Patient is a current smoker. Discussed smoking cessation with patient. Referred to the [facility] Quitline.   Patient Navigation: Patient education provided. Access to services provided for patient through [place] program. Spanish interpreter Rudene Anda from [facility] provided.   Colorectal  Cancer Screening: Per patient  has never had colonoscopy completed. Patient stated her PCP gave her a cologuard test to complete. Explained the importance of completing the cologuard. No complaints today.    Breast and Cervical Cancer Risk Assessment: Patient has family history of her mother having breast cancer. Patient has no known genetic mutations or history of radiation treatment to the chest before age 54. Patient does not have history of cervical dysplasia, immunocompromised, or DES exposure in-utero.  Risk Assessment     Risk Scores       08/18/2022 05/15/2021   Last edited by: Royston Bake, CMA McGill, Sherie Mamie Nick, LPN   5-year risk: 1.2 % 1.1 %   Lifetime risk: 14.2 % 14.4 %            A: BCCCP exam without pap smear No complaints.  P: Referred patient to the [facility] for a diagnostic mammogram. Appointment scheduled Thursday, August 27, 2022 at [place].  Loletta Parish, RN 08/18/2022 9:42 AM

## 2022-08-20 LAB — CYTOLOGY - NON PAP

## 2022-08-27 ENCOUNTER — Ambulatory Visit
Admission: RE | Admit: 2022-08-27 | Discharge: 2022-08-27 | Disposition: A | Discharge status: Home / Self Care | Payer: Self-pay | Source: Ambulatory Visit | Attending: Obstetrics and Gynecology | Admitting: Obstetrics and Gynecology

## 2022-08-27 ENCOUNTER — Ambulatory Visit
Admission: RE | Admit: 2022-08-27 | Discharge: 2022-08-27 | Disposition: A | Discharge status: Home / Self Care | Payer: No Typology Code available for payment source | Source: Ambulatory Visit | Attending: Obstetrics and Gynecology | Admitting: Obstetrics and Gynecology

## 2022-08-27 DIAGNOSIS — N6452 Nipple discharge: Secondary | ICD-10-CM

## 2022-09-02 ENCOUNTER — Telehealth: Payer: Self-pay

## 2022-09-02 NOTE — Telephone Encounter (Signed)
08/28/2022-Via Delorise Royals, Spanish Interpreter [facility]), Patient informed negative FIT test results, verbalized understanding.

## 2022-09-04 ENCOUNTER — Telehealth: Payer: Self-pay

## 2022-09-04 NOTE — Telephone Encounter (Signed)
Called patient via [employer] [phone] informed patient that right breast discharge was benign and that no malignant cells were identified in sample. Per radiologists recommendation monitor any new discharge. If discharge becomes clear or bloody give [place] a call back. Patient voiced understanding.

## 2022-09-14 ENCOUNTER — Other Ambulatory Visit: Payer: Self-pay

## 2022-11-17 ENCOUNTER — Telehealth: Payer: Self-pay

## 2022-11-17 NOTE — Telephone Encounter (Signed)
Health Coaching 3: 11/16/22  interpreter- Rudene Anda, [employer]   Goals- Diet changes. Patient would like to practice a more healthy diet. Patient will increase fruit and vegetable intake to 1 serving each day. Patient will work on making this change over the next month.     New goal- Walking more.  Barrier to reaching goal- Patient has not been able to exercise (walk) due to cold weather.   Strategies to overcome- Patient prefers walking as her exercise. Spoke with patient about finding a place to do indoor walking like a mall, etc.   Navigation:  Patient is aware of  a follow up session. Patient is scheduled for FU on 12/23/22 @ 9:30 am.   Time- 10 minutes

## 2022-12-14 ENCOUNTER — Other Ambulatory Visit: Payer: Self-pay | Admitting: Internal Medicine

## 2022-12-14 DIAGNOSIS — E119 Type 2 diabetes mellitus without complications: Secondary | ICD-10-CM

## 2022-12-14 DIAGNOSIS — I152 Hypertension secondary to endocrine disorders: Secondary | ICD-10-CM

## 2022-12-14 DIAGNOSIS — E1169 Type 2 diabetes mellitus with other specified complication: Secondary | ICD-10-CM

## 2022-12-14 DIAGNOSIS — E1159 Type 2 diabetes mellitus with other circulatory complications: Secondary | ICD-10-CM

## 2022-12-14 DIAGNOSIS — E669 Obesity, unspecified: Secondary | ICD-10-CM

## 2022-12-14 DIAGNOSIS — E785 Hyperlipidemia, unspecified: Secondary | ICD-10-CM

## 2022-12-14 NOTE — Telephone Encounter (Signed)
Medication Refill - Medication: metFORMIN (GLUCOPHAGE) 500 MG tablet  losartan (COZAAR) 50 MG tablet  atorvastatin (LIPITOR) 10 MG tablet    Has the patient contacted their pharmacy? No.  Pt states that she has only two pills left of each medication.    Preferred Pharmacy (with phone number or street name):  [place] Phone: [phone]  Fax: [phone]     Has the patient been seen for an appointment in the last year OR does the patient have an upcoming appointment? No.  Agent: Please be advised that RX refills may take up to 3 business days. We ask that you follow-up with your pharmacy.

## 2022-12-15 ENCOUNTER — Other Ambulatory Visit: Payer: Self-pay

## 2022-12-15 ENCOUNTER — Other Ambulatory Visit: Payer: Self-pay | Admitting: *Deleted

## 2022-12-15 ENCOUNTER — Ambulatory Visit: Payer: Self-pay | Admitting: Internal Medicine

## 2022-12-15 DIAGNOSIS — E1169 Type 2 diabetes mellitus with other specified complication: Secondary | ICD-10-CM

## 2022-12-15 NOTE — Telephone Encounter (Signed)
Call to pharmacy- patient does have RF on all. Call to patient- interpreter: [phone]- patient notified of medication refill Requested Prescriptions  Pending Prescriptions Disp Refills   losartan (COZAAR) 50 MG tablet 90 tablet 2    Sig: Take 1 tablet (50 mg total) by mouth daily.     Cardiovascular:  Angiotensin Receptor Blockers Failed - 12/14/2022  5:00 PM      Failed - Cr in normal range and within 180 days    Creatinine, Ser  Date Value Ref Range Status  10/21/2021 0.78 0.57 - 1.00 mg/dL Final         Failed - K in normal range and within 180 days    Potassium  Date Value Ref Range Status  10/21/2021 4.5 3.5 - 5.2 mmol/L Final         Passed - Patient is not pregnant      Passed - Last BP in normal range    BP Readings from Last 1 Encounters:  08/18/22 130/82         Passed - Valid encounter within last 6 months    Recent Outpatient Visits           4 months ago Type 2 diabetes mellitus with obesity ([place])   [place] Karle Plumber B, MD   8 months ago Type 2 diabetes mellitus with obesity [facility])   [place] Karle Plumber B, MD   1 year ago Type 2 diabetes mellitus with obesity [facility])   [place] Karle Plumber B, MD   1 year ago Essential hypertension   [place], Jarome Matin, RPH-CPP   1 year ago Essential hypertension   [place], Jarome Matin, RPH-CPP       Future Appointments             In 3 months Ladell Pier, MD [place]             metFORMIN (GLUCOPHAGE) 500 MG tablet 90 tablet 2    Sig: Take 1 tablet (500 mg total) by mouth daily with breakfast.     Endocrinology:  Diabetes - Biguanides Failed - 12/14/2022  5:00 PM      Failed - Cr in normal range and within 360 days    Creatinine, Ser   Date Value Ref Range Status  10/21/2021 0.78 0.57 - 1.00 mg/dL Final         Failed - eGFR in normal range and within 360 days    eGFR  Date Value Ref Range Status  10/21/2021 96 >59 mL/min/1.73 Final         Failed - B12 Level in normal range and within 720 days    No results found for: "VITAMINB12"       Failed - CBC within normal limits and completed in the last 12 months    WBC  Date Value Ref Range Status  06/30/2021 10.1 3.4 - 10.8 x10E3/uL Final   RBC  Date Value Ref Range Status  06/30/2021 5.05 3.77 - 5.28 x10E6/uL Final   Hemoglobin  Date Value Ref Range Status  06/30/2021 15.2 11.1 - 15.9 g/dL Final   Hematocrit  Date Value Ref Range Status  06/30/2021 43.7 34.0 - 46.6 % Final   MCHC  Date Value Ref Range Status  06/30/2021 34.8 31.5 -  35.7 g/dL Final   [facility]  Date Value Ref Range Status  06/30/2021 30.1 26.6 - 33.0 pg Final   MCV  Date Value Ref Range Status  06/30/2021 87 79 - 97 fL Final   No results found for: "PLTCOUNTKUC", "LABPLAT", "POCPLA" RDW  Date Value Ref Range Status  06/30/2021 13.1 11.7 - 15.4 % Final         Passed - HBA1C is between 0 and 7.9 and within 180 days    HbA1c, POC (prediabetic range)  Date Value Ref Range Status  04/13/2022 6.0 5.7 - 6.4 % Final   Hgb A1c MFr Bld  Date Value Ref Range Status  06/24/2022 6.2 (H) 4.8 - 5.6 % Final    Comment:             Prediabetes: 5.7 - 6.4          Diabetes: >6.4          Glycemic control for adults with diabetes: <7.0          Passed - Valid encounter within last 6 months    Recent Outpatient Visits           4 months ago Type 2 diabetes mellitus with obesity ([place])   [place] Karle Plumber B, MD   8 months ago Type 2 diabetes mellitus with obesity [facility])   [place] Karle Plumber B, MD   1 year ago Type 2 diabetes mellitus with obesity [facility])   [place]  Ladell Pier, MD   1 year ago Essential hypertension   [place], Jarome Matin, RPH-CPP   1 year ago Essential hypertension   [place], Jarome Matin, RPH-CPP       Future Appointments             In 3 months Ladell Pier, MD [place]             atorvastatin (LIPITOR) 10 MG tablet 90 tablet 2    Sig: Take 1 tablet (10 mg total) by mouth daily.     Cardiovascular:  Antilipid - Statins Failed - 12/14/2022  5:00 PM      Failed - Lipid Panel in normal range within the last 12 months    Cholesterol, Total  Date Value Ref Range Status  06/24/2022 163 100 - 199 mg/dL Final   LDL Chol Calc (NIH)  Date Value Ref Range Status  06/24/2022 69 0 - 99 mg/dL Final   HDL  Date Value Ref Range Status  06/24/2022 80 >39 mg/dL Final   Triglycerides  Date Value Ref Range Status  06/24/2022 76 0 - 149 mg/dL Final         Passed - Patient is not pregnant      Passed - Valid encounter within last 12 months    Recent Outpatient Visits           4 months ago Type 2 diabetes mellitus with obesity ([place])   [place] Karle Plumber B, MD   8 months ago Type 2 diabetes mellitus with obesity [facility])   [place] Karle Plumber B, MD   1 year ago Type 2 diabetes mellitus with obesity [facility])   [place] Ladell Pier, MD   1 year ago Essential hypertension   [place], RPH-CPP   1 year ago Essential hypertension   [place], RPH-CPP       Future Appointments             In 3 months Wynetta Emery, Dalbert Batman, MD [place]

## 2022-12-15 NOTE — Telephone Encounter (Signed)
Patient requesting refill of True Metrix test strips

## 2022-12-15 NOTE — Telephone Encounter (Signed)
Requested medication (s) are due for refill today - expired Rx  Requested medication (s) are on the active medication list =yes  Future visit scheduled -yes  Last refill: 06/30/21 #100 12RF  Notes to clinic: expired Rx/supplies  Requested Prescriptions  Pending Prescriptions Disp Refills   glucose blood (TRUE METRIX BLOOD GLUCOSE TEST) test strip 100 each 12    Sig: Use as instructed     Endocrinology: Diabetes - Testing Supplies Passed - 12/15/2022  1:00 PM      Passed - Valid encounter within last 12 months    Recent Outpatient Visits           4 months ago Type 2 diabetes mellitus with obesity ([place])   [place] Karle Plumber B, MD   8 months ago Type 2 diabetes mellitus with obesity [facility])   [place] Karle Plumber B, MD   1 year ago Type 2 diabetes mellitus with obesity [facility])   [place] Ladell Pier, MD   1 year ago Essential hypertension   [place], Jarome Matin, RPH-CPP   1 year ago Essential hypertension   [place], Jarome Matin, RPH-CPP       Future Appointments             In 3 months Wynetta Emery, Dalbert Batman, MD [place]               Requested Prescriptions  Pending Prescriptions Disp Refills   glucose blood (TRUE METRIX BLOOD GLUCOSE TEST) test strip 100 each 12    Sig: Use as instructed     Endocrinology: Diabetes - Testing Supplies Passed - 12/15/2022  1:00 PM      Passed - Valid encounter within last 12 months    Recent Outpatient Visits           4 months ago Type 2 diabetes mellitus with obesity ([place])   [place] Karle Plumber B, MD   8 months ago Type 2 diabetes mellitus with obesity [facility])   [place] Karle Plumber B,  MD   1 year ago Type 2 diabetes mellitus with obesity [facility])   [place] Ladell Pier, MD   1 year ago Essential hypertension   [place], RPH-CPP   1 year ago Essential hypertension   [place], Jarome Matin, RPH-CPP       Future Appointments             In 3 months Wynetta Emery, Dalbert Batman, MD [place]

## 2022-12-16 ENCOUNTER — Other Ambulatory Visit: Payer: Self-pay

## 2022-12-16 ENCOUNTER — Other Ambulatory Visit: Payer: Self-pay | Admitting: Internal Medicine

## 2022-12-16 DIAGNOSIS — E1169 Type 2 diabetes mellitus with other specified complication: Secondary | ICD-10-CM

## 2022-12-17 NOTE — Telephone Encounter (Signed)
Requested medication (s) are due for refill today - expired Rx  Requested medication (s) are on the active medication list -yes  Future visit scheduled -yes  Last refill: 06/30/21  Notes to clinic: expired Rx/supplies  Requested Prescriptions  Pending Prescriptions Disp Refills   glucose blood (TRUE METRIX BLOOD GLUCOSE TEST) test strip 100 each 12    Sig: Use as instructed     Endocrinology: Diabetes - Testing Supplies Passed - 12/16/2022  4:50 PM      Passed - Valid encounter within last 12 months    Recent Outpatient Visits           4 months ago Type 2 diabetes mellitus with obesity ([place])   [place] Karle Plumber B, MD   8 months ago Type 2 diabetes mellitus with obesity [facility])   [place] Karle Plumber B, MD   1 year ago Type 2 diabetes mellitus with obesity [facility])   [place] Ladell Pier, MD   1 year ago Essential hypertension   [place], Jarome Matin, RPH-CPP   1 year ago Essential hypertension   [place], Jarome Matin, RPH-CPP       Future Appointments             In 3 months Wynetta Emery, Dalbert Batman, MD [place] Lanc[address] [place] 100 each 4    Sig: Use as directed     Endocrinology: Diabetes - Testing Supplies Passed - 12/16/2022  4:50 PM      Passed - Valid encounter within last 12 months    Recent Outpatient Visits           4 months ago Type 2 diabetes mellitus with obesity ([place])   [place] Karle Plumber B, MD   8 months ago Type 2 diabetes mellitus with obesity [facility])   [place] Karle Plumber B, MD   1 year ago Type 2 diabetes mellitus with obesity [facility])   [place]  Ladell Pier, MD   1 year ago Essential hypertension   [place], Jarome Matin, RPH-CPP   1 year ago Essential hypertension   [place], Jarome Matin, RPH-CPP       Future Appointments             In 3 months Wynetta Emery, Dalbert Batman, MD [place]               Requested Prescriptions  Pending Prescriptions Disp Refills   glucose blood (TRUE METRIX BLOOD GLUCOSE TEST) test strip 100 each 12    Sig: Use as instructed     Endocrinology: Diabetes - Testing Supplies Passed - 12/16/2022  4:50 PM      Passed - Valid encounter within last 12 months    Recent Outpatient Visits           4 months ago Type 2 diabetes mellitus with obesity [facility])   [place] Karle Plumber B, MD   8 months ago Type 2 diabetes mellitus with obesity ([place])  [place] Karle Plumber B, MD   1 year ago Type 2 diabetes mellitus with obesity [facility])   [place], MD   1 year ago Essential hypertension   [place], RPH-CPP   1 year ago Essential hypertension   [place], RPH-CPP       Future Appointments             In 3 months Wynetta Emery, Dalbert Batman, MD [place] 100 each 4    Sig: Use as directed     Endocrinology: Diabetes - Testing Supplies Passed - 12/16/2022  4:50 PM      Passed - Valid encounter within last 12 months    Recent Outpatient Visits           4 months ago Type 2 diabetes mellitus with obesity [facility])   [place] Karle Plumber B, MD   8 months ago Type 2 diabetes mellitus with obesity [facility])    [place] Karle Plumber B, MD   1 year ago Type 2 diabetes mellitus with obesity [facility])   [place] Ladell Pier, MD   1 year ago Essential hypertension   [place], RPH-CPP   1 year ago Essential hypertension   [place], [place], RPH-CPP       Future Appointments             In 3 months Wynetta Emery, Dalbert Batman, MD [place]

## 2022-12-18 ENCOUNTER — Other Ambulatory Visit: Payer: Self-pay

## 2022-12-18 MED ORDER — TRUE METRIX BLOOD GLUCOSE TEST VI STRP
ORAL_STRIP | 2 refills | Status: DC
  Filled 2022-12-18: qty 100, 25d supply, fill #0

## 2022-12-18 MED ORDER — TRUEPLUS LANCETS 28G MISC
2 refills | Status: AC
  Filled 2022-12-18: qty 100, 25d supply, fill #0

## 2022-12-18 MED ORDER — TRUE METRIX BLOOD GLUCOSE TEST VI STRP
ORAL_STRIP | 3 refills | Status: AC
  Filled 2022-12-18: qty 100, 25d supply, fill #0

## 2022-12-23 ENCOUNTER — Inpatient Hospital Stay: Payer: Self-pay | Attending: Obstetrics and Gynecology | Admitting: *Deleted

## 2022-12-23 VITALS — BP 142/90 | Ht 63.0 in | Wt 194.5 lb

## 2022-12-23 DIAGNOSIS — Z0000 Encounter for general adult medical examination without abnormal findings: Secondary | ICD-10-CM

## 2022-12-23 NOTE — Progress Notes (Signed)
[facility] follow up   Interpreter: [employer] [phone]  Clinical Measurement:   Vitals:   12/23/22 0919 12/23/22 0933  BP: (!) 142/90 (!) 142/90      Medical History: Patient states that she does not have high cholesterol, has high blood pressure and she has diabetes.  Medications: Patient states that she does take medication to lower cholesterol, blood pressure and blood sugar.  Patient does not take an aspirin a day to help prevent a heart attack or stroke. During the past 7 days patient has taken prescribed medication to lower cholesterol, blood pressure and blood sugar on 7 days.   Blood pressure, self measurement: Patient states that she does measure blood pressure from home. She checks her blood pressure weekly. She shares her readings with a health care provider: yes.   Nutrition: Patient states that on average she eats 1 cups of fruit and 0 cups of vegetables per day. Patient states that she does not eat fish at least 2 times per week. Patient eats less than half servings of whole grains. Patient drinks less than 36 ounces of beverages with added sugar weekly: yes. Patient is currently watching sodium or salt intake: yes. In the past 7 days patient has had 1 drinks containing alcohol. On average patient drinks 3 drinks containing alcohol per day.      Physical activity: Patient states that she gets 0 minutes of moderate and 0 minutes of vigorous physical activity each week.  Smoking status: Patient states that she has is a current smoker .   Quality of life: Over the past 2 weeks patient states that she had little interest or pleasure in doing things: not at all. She has been feeling down, depressed or hopeless:not at all.   Social Determinants of Health Assessment:   Food Insecurities: During the last 12 months, where there any times when you were worried that you would run out of food because of a lack of money or other resources: No.   Transportation Barriers: During  the last 12 months, have you missed a doctor's appointment because of transportation problems: No.     Risk reduction and counseling:   Health Coaching: Spoke with patient about adding more fruits and vegetables into daily diet. Explained that the recommendation is for 2 cups of fruit and 3 cups of vegetables per day. Patient does not consume fish very often. Explained the benefits of adding heart healthy fish into weekly diet. Gave suggestions for salmon, tuna, mackerel, sardines, sea bass or trout. Patient does not consume any whole grains regularly. Gave suggestions for whole wheat bread, oatmeal, breakfast cereals, brown rice and whole wheat pasta. Patient does not drink any beverages with added sugars. Patient does try and watch how much salt that she cooks with and consumes. Patient has not been exercising in the past few months because of the cold weather. Encouraged patient to try and start walking again now that it is starting to get warmer outside. Patient is still smoking cigarettes "some days". Patient has tried NRT patches as well as prescription chantix but both upset her stomach. Offered to refer patient to the [place]uit[place] but patient did not want referral. Also offered to refer patient back to her PCP for follow-up for her elevated blood pressure but patient said she would wait to see her in June for her already scheduled follow-up.    Navigation: This was the  follow up session for this patient, I will check up on her progress in the coming  months.  Time: 20 minutes

## 2022-12-25 ENCOUNTER — Other Ambulatory Visit: Payer: Self-pay

## 2023-03-19 ENCOUNTER — Other Ambulatory Visit: Payer: Self-pay

## 2023-03-26 ENCOUNTER — Ambulatory Visit: Payer: Self-pay | Admitting: Internal Medicine

## 2023-06-10 ENCOUNTER — Encounter: Payer: Self-pay | Admitting: Internal Medicine

## 2023-06-10 ENCOUNTER — Ambulatory Visit: Payer: Self-pay | Attending: Internal Medicine | Admitting: Internal Medicine

## 2023-06-10 ENCOUNTER — Other Ambulatory Visit: Payer: Self-pay

## 2023-06-10 VITALS — BP 142/82 | HR 100 | Temp 98.2°F | Ht 63.0 in | Wt 195.0 lb

## 2023-06-10 DIAGNOSIS — Z1211 Encounter for screening for malignant neoplasm of colon: Secondary | ICD-10-CM

## 2023-06-10 DIAGNOSIS — Z7985 Long-term (current) use of injectable non-insulin antidiabetic drugs: Secondary | ICD-10-CM

## 2023-06-10 DIAGNOSIS — F17200 Nicotine dependence, unspecified, uncomplicated: Secondary | ICD-10-CM

## 2023-06-10 DIAGNOSIS — E785 Hyperlipidemia, unspecified: Secondary | ICD-10-CM

## 2023-06-10 DIAGNOSIS — E1159 Type 2 diabetes mellitus with other circulatory complications: Secondary | ICD-10-CM

## 2023-06-10 DIAGNOSIS — E669 Obesity, unspecified: Secondary | ICD-10-CM

## 2023-06-10 DIAGNOSIS — I152 Hypertension secondary to endocrine disorders: Secondary | ICD-10-CM

## 2023-06-10 DIAGNOSIS — E1169 Type 2 diabetes mellitus with other specified complication: Secondary | ICD-10-CM

## 2023-06-10 LAB — POCT GLYCOSYLATED HEMOGLOBIN (HGB A1C): HbA1c, POC (controlled diabetic range): 7.8 % — AB (ref 0.0–7.0)

## 2023-06-10 LAB — GLUCOSE, POCT (MANUAL RESULT ENTRY): POC Glucose: 225 mg/dL — AB (ref 70–99)

## 2023-06-10 MED ORDER — METFORMIN HCL 500 MG PO TABS
500.0000 mg | ORAL_TABLET | Freq: Two times a day (BID) | ORAL | 1 refills | Status: DC
  Filled 2023-06-10: qty 180, 90d supply, fill #0

## 2023-06-10 MED ORDER — TRUE METRIX BLOOD GLUCOSE TEST VI STRP
ORAL_STRIP | 2 refills | Status: DC
  Filled 2023-06-10: qty 100, 25d supply, fill #0

## 2023-06-10 MED ORDER — ATORVASTATIN CALCIUM 10 MG PO TABS
10.0000 mg | ORAL_TABLET | Freq: Every day | ORAL | 2 refills | Status: DC
  Filled 2023-06-10: qty 90, 90d supply, fill #0

## 2023-06-10 MED ORDER — LOSARTAN POTASSIUM 50 MG PO TABS
75.0000 mg | ORAL_TABLET | Freq: Every day | ORAL | 1 refills | Status: DC
  Filled 2023-06-10: qty 135, 90d supply, fill #0

## 2023-06-10 NOTE — Progress Notes (Signed)
Patient ID: Jessica Hancock, female    DOB: 1976/02/01  MRN: [id number]  CC: Diabetes (DM f/u. Med refills. /No questions / concerns/Pt will have pap done in Nov.)   Subjective: Jessica Hancock is a 47 y.o. female who presents for chronic ds management Her concerns today include:  Pt with hx of DM, HL, HTN, obesity, tob dep, ETOH use disorder.     AMN Language interpreter used during this encounter. #Pedro [id number]  DM: Results for orders placed or performed in visit on 06/10/23  POCT glucose (manual entry)  Result Value Ref Range   POC Glucose 225 (A) 70 - 99 mg/dl  K7Q 7.8 Reports compliance with metformin 500 mg daily. Not checking BS; out of stripes EATING MORE FRUITS AND VEGGIES. Not much exercise Due for eye exam.  No insurance  HTN:  HYPERTENSION Currently taking: see medication list, Cozaar 50 mg daily Med Adherence: [x]  Yes    []  No Medication side effects: [x]  Yes    [x]  No Adherence with salt restriction: [x]  Yes    []  No Home Monitoring?: [x]  Yes    []  No Monitoring Frequency: 1-2x/wk Home BP results range: 137/79, SBP 130-142 SOB? []  Yes    [x]  No Chest Pain?: []  Yes    [x]  No Leg swelling?: []  Yes    [x]  No Headaches?: []  Yes    [x]  No Dizziness? []  Yes    [x]  No Comments:   HL: Reports compliance with atorvastatin. No muscles aches Last LDL was 69.  Tob dep: still smoking 2-3 cigarettes/wk.   HM:  due for colon CA screen, PAP Patient Active Problem List   Diagnosis Date Noted   Diabetes Mellitus, Type 2 06/17/2021   Hyperlipidemia associated with type 2 diabetes mellitus (HCC) 06/17/2021   Obesity 06/17/2021   Tobacco use 06/17/2021   Alcohol consumption heavy 06/17/2021     Current Outpatient Medications on File Prior to Visit  Medication Sig Dispense Refill   Blood Glucose Monitoring Suppl (TRUE METRIX METER) w/Device KIT Use as directed 1 kit 0   glucose blood (TRUE METRIX BLOOD GLUCOSE TEST) test strip Use as  instructed 100 each 3   TRUEplus Lancets 28G MISC Use as directed 100 each 2   varenicline (CHANTIX) 0.5 MG tablet Take 1 tablet by mouth daily for 3 days, then increase to 1 tablet twice daily until finished. (Patient not taking: Reported on 06/10/2023) 11 tablet 0   varenicline (CHANTIX) 1 MG tablet Take 1 tablet by mouth 2 times daily. Do not start this until you complete the 0.5 mg prescription. (Patient not taking: Reported on 06/10/2023) 60 tablet 1   No current facility-administered medications on file prior to visit.    No Known Allergies  Social History   Socioeconomic History   Marital status: Married    Spouse name: Not on file   Number of children: 1   Years of education: Not on file   Highest education level: Associate degree: occupational, Scientist, product/process development, or vocational program  Occupational History   Not on file  Tobacco Use   Smoking status: Every Day    Current packs/day: 0.10    Average packs/day: 0.1 packs/day for 15.0 years (1.5 ttl pk-yrs)    Types: Cigarettes   Smokeless tobacco: Never   Tobacco comments:    Not every day 12/23/22  Vaping Use   Vaping status: Never Used  Substance and Sexual Activity   Alcohol use: Yes    Comment: occasionally  Drug use: Never   Sexual activity: Yes    Birth control/protection: None  Other Topics Concern   Not on file  Social History Narrative   ** Merged History Encounter **       Social Determinants of Health   Financial Resource Strain: Not on file  Food Insecurity: No Food Insecurity (12/23/2022)   Hunger Vital Sign    Worried About Running Out of Food in the Last Year: Never true    Ran Out of Food in the Last Year: Never true  Transportation Needs: No Transportation Needs (12/23/2022)   PRAPARE - Administrator, Civil Service (Medical): No    Lack of Transportation (Non-Medical): No  Physical Activity: Not on file  Stress: Not on file  Social Connections: Not on file  Intimate Partner Violence: Not on  file    Family History  Problem Relation Age of Onset   Diabetes Mother    Breast cancer Mother    Hypertension Father     No past surgical history on file.  ROS: Review of Systems Negative except as stated above  PHYSICAL EXAM: BP (!) 142/82   Pulse 100   Temp 98.2 F (36.8 C) (Oral)   Ht 5\' 3"  (1.6 m)   Wt 195 lb (88.5 kg)   LMP 04/03/2023   SpO2 98%   BMI 34.54 kg/m   Wt Readings from Last 3 Encounters:  06/10/23 195 lb (88.5 kg)  12/23/22 194 lb 8 oz (88.2 kg)  08/18/22 187 lb 14.4 oz (85.2 kg)    Physical Exam  General appearance - alert, well appearing, and in no distress Mental status - normal mood, behavior, speech, dress, motor activity, and thought processes Neck - supple, no significant adenopathy Chest - clear to auscultation, no wheezes, rales or rhonchi, symmetric air entry Heart - normal rate, regular rhythm, normal S1, S2, no murmurs, rubs, clicks or gallops Extremities - peripheral pulses normal, no pedal edema, no clubbing or cyanosis      Latest Ref Rng & Units 06/30/2022    9:11 AM 06/24/2022   10:57 AM 12/04/2021    9:21 AM  CMP  Glucose 70 - 99 mg/dL 161  096    Total Protein 6.0 - 8.5 g/dL   6.6   Total Bilirubin 0.0 - 1.2 mg/dL   0.7   Alkaline Phos 44 - 121 IU/L   62   AST 0 - 40 IU/L   15   ALT 0 - 32 IU/L   10    Lipid Panel     Component Value Date/Time   CHOL 163 06/24/2022 1057   TRIG 76 06/24/2022 1057   HDL 80 06/24/2022 1057   CHOLHDL 2.0 06/24/2022 1057   LDLCALC 69 06/24/2022 1057    CBC    Component Value Date/Time   WBC 10.1 06/30/2021 1212   RBC 5.05 06/30/2021 1212   HGB 15.2 06/30/2021 1212   HCT 43.7 06/30/2021 1212   PLT 280 06/30/2021 1212   MCV 87 06/30/2021 1212   MCH 30.1 06/30/2021 1212   MCHC 34.8 06/30/2021 1212   RDW 13.1 06/30/2021 1212    ASSESSMENT AND PLAN: 1. Type 2 diabetes mellitus with obesity (HCC) Not at goal.  Increase metformin to 500 mg twice a day. Discussed and encourage  healthy eating habits. Encouraged her to exercise at least 3 to 5 days a week for 30 minutes. Get eye exam when she can afford. - POCT glycosylated hemoglobin (Hb A1C) -  POCT glucose (manual entry) - metFORMIN (GLUCOPHAGE) 500 MG tablet; Take 1 tablet (500 mg total) by mouth 2 (two) times daily with a meal.  Dispense: 180 tablet; Refill: 1 - glucose blood (TRUE METRIX BLOOD GLUCOSE TEST) test strip; Use as instructed  Dispense: 100 each; Refill: 2 - CBC - Comprehensive metabolic panel  2. Hypertension associated with diabetes (HCC) Not at goal.  Increase Cozaar to 75 mg daily. - losartan (COZAAR) 50 MG tablet; Take 1.5 tablets (75 mg total) by mouth daily.  Dispense: 135 tablet; Refill: 1  3. Hyperlipidemia associated with type 2 diabetes mellitus (HCC) - atorvastatin (LIPITOR) 10 MG tablet; Take 1 tablet (10 mg total) by mouth daily.  Dispense: 90 tablet; Refill: 2 - Lipid panel  4. Tobacco dependence Strongly advised to quit.  5. Screening for colon cancer - Fecal occult blood, imunochemical([facility]/Sun[facility])   Patient was given the opportunity to ask questions.  Patient verbalized understanding of the plan and was able to repeat key elements of the plan.   This documentation was completed using Paediatric nurse.  Any transcriptional errors are unintentional.  Orders Placed This Encounter  Procedures   Fecal occult blood, imunochemical([facility]/Sunquest)   CBC   Comprehensive metabolic panel   Lipid panel   POCT glycosylated hemoglobin (Hb A1C)   POCT glucose (manual entry)     Requested Prescriptions   Signed Prescriptions Disp Refills   metFORMIN (GLUCOPHAGE) 500 MG tablet 180 tablet 1    Sig: Take 1 tablet (500 mg total) by mouth 2 (two) times daily with a meal.   losartan (COZAAR) 50 MG tablet 135 tablet 1    Sig: Take 1.5 tablets (75 mg total) by mouth daily.   atorvastatin (LIPITOR) 10 MG tablet 90 tablet 2    Sig: Take 1 tablet (10 mg total)  by mouth daily.   glucose blood (TRUE METRIX BLOOD GLUCOSE TEST) test strip 100 each 2    Sig: Use as instructed    Return in about 6 weeks (around 07/22/2023).  Jonah Blue, MD, FACP

## 2023-06-11 ENCOUNTER — Other Ambulatory Visit: Payer: Self-pay | Admitting: Internal Medicine

## 2023-06-11 ENCOUNTER — Other Ambulatory Visit: Payer: Self-pay

## 2023-06-11 DIAGNOSIS — E785 Hyperlipidemia, unspecified: Secondary | ICD-10-CM

## 2023-06-11 LAB — COMPREHENSIVE METABOLIC PANEL
ALT: 29 IU/L (ref 0–32)
AST: 30 IU/L (ref 0–40)
Albumin: 4.6 g/dL (ref 3.9–4.9)
Alkaline Phosphatase: 89 IU/L (ref 44–121)
BUN/Creatinine Ratio: 15 (ref 9–23)
BUN: 12 mg/dL (ref 6–24)
Bilirubin Total: 0.3 mg/dL (ref 0.0–1.2)
CO2: 21 mmol/L (ref 20–29)
Calcium: 9.6 mg/dL (ref 8.7–10.2)
Chloride: 99 mmol/L (ref 96–106)
Creatinine, Ser: 0.82 mg/dL (ref 0.57–1.00)
Globulin, Total: 2.4 g/dL (ref 1.5–4.5)
Glucose: 208 mg/dL — ABNORMAL HIGH (ref 70–99)
Potassium: 4.3 mmol/L (ref 3.5–5.2)
Sodium: 136 mmol/L (ref 134–144)
Total Protein: 7 g/dL (ref 6.0–8.5)
eGFR: 89 mL/min/{1.73_m2} (ref >59)

## 2023-06-11 LAB — CBC
Hematocrit: 38.5 % (ref 34.0–46.6)
Hemoglobin: 12.7 g/dL (ref 11.1–15.9)
MCH: 28.2 pg (ref 26.6–33.0)
MCHC: 33 g/dL (ref 31.5–35.7)
MCV: 86 fL (ref 79–97)
Platelets: 303 10*3/uL (ref 150–450)
RBC: 4.5 x10E6/uL (ref 3.77–5.28)
RDW: 13.5 % (ref 11.7–15.4)
WBC: 9 10*3/uL (ref 3.4–10.8)

## 2023-06-11 LAB — LIPID PANEL
Chol/HDL Ratio: 2.6 ratio (ref 0.0–4.4)
Cholesterol, Total: 177 mg/dL (ref 100–199)
HDL: 69 mg/dL (ref >39)
LDL Chol Calc (NIH): 84 mg/dL (ref 0–99)
Triglycerides: 140 mg/dL (ref 0–149)
VLDL Cholesterol Cal: 24 mg/dL (ref 5–40)

## 2023-06-11 MED ORDER — ATORVASTATIN CALCIUM 20 MG PO TABS
20.0000 mg | ORAL_TABLET | Freq: Every day | ORAL | 1 refills | Status: DC
Start: 2023-06-11 — End: 2023-09-30
  Filled 2023-06-11: qty 90, 90d supply, fill #0

## 2023-08-02 ENCOUNTER — Ambulatory Visit: Payer: Self-pay | Admitting: Internal Medicine

## 2023-09-30 ENCOUNTER — Other Ambulatory Visit: Payer: Self-pay | Admitting: Internal Medicine

## 2023-09-30 ENCOUNTER — Other Ambulatory Visit: Payer: Self-pay

## 2023-09-30 DIAGNOSIS — E785 Hyperlipidemia, unspecified: Secondary | ICD-10-CM

## 2023-09-30 DIAGNOSIS — E1159 Type 2 diabetes mellitus with other circulatory complications: Secondary | ICD-10-CM

## 2023-09-30 DIAGNOSIS — E1169 Type 2 diabetes mellitus with other specified complication: Secondary | ICD-10-CM

## 2023-09-30 MED ORDER — METFORMIN HCL 500 MG PO TABS
500.0000 mg | ORAL_TABLET | Freq: Two times a day (BID) | ORAL | 0 refills | Status: DC
  Filled 2023-09-30: qty 180, 90d supply, fill #0

## 2023-09-30 MED ORDER — LOSARTAN POTASSIUM 50 MG PO TABS
75.0000 mg | ORAL_TABLET | Freq: Every day | ORAL | 0 refills | Status: DC
Start: 2023-09-30 — End: 2024-01-11
  Filled 2023-09-30: qty 135, 90d supply, fill #0

## 2023-09-30 MED ORDER — ATORVASTATIN CALCIUM 20 MG PO TABS
20.0000 mg | ORAL_TABLET | Freq: Every day | ORAL | 0 refills | Status: DC
Start: 2023-09-30 — End: 2024-01-11
  Filled 2023-09-30: qty 90, 90d supply, fill #0

## 2023-09-30 NOTE — Telephone Encounter (Signed)
 Medication Refill -  Most Recent Primary Care Visit:  Provider: Jonah Blue B  Department: [facility]  Visit Type: OFFICE VISIT  Date: 06/10/2023  Medication: atorvastatin (LIPITOR) 20 MG tablet [[id number]] losartan (COZAAR) 50 MG tablet [[id number]] metFORMIN (GLUCOPHAGE) 500 MG tablet [[id number]]   Has the patient contacted their pharmacy? Yes (Agent: If yes, when and what did the pharmacy advise?) contact provider   Is this the correct pharmacy for this prescription? Yes  This is the patient's preferred pharmacy:  [facility] [address] [place] [address] Phone: [phone] Fax: [phone]   Has the prescription been filled recently? Yes  Is the patient out of the medication? Yes  Has the patient been seen for an appointment in the last year OR does the patient have an upcoming appointment? Yes  Can we respond through MyChart? Yes  Agent: Please be advised that Rx refills may take up to 3 business days. We ask that you follow-up with your pharmacy.

## 2023-09-30 NOTE — Telephone Encounter (Signed)
 Requested Prescriptions  Pending Prescriptions Disp Refills   atorvastatin (LIPITOR) 20 MG tablet 90 tablet 0    Sig: Take 1 tablet (20 mg total) by mouth daily.     Cardiovascular:  Antilipid - Statins Failed - 09/30/2023  3:46 PM      Failed - Lipid Panel in normal range within the last 12 months    Cholesterol, Total  Date Value Ref Range Status  06/10/2023 177 100 - 199 mg/dL Final   LDL Chol Calc (NIH)  Date Value Ref Range Status  06/10/2023 84 0 - 99 mg/dL Final   HDL  Date Value Ref Range Status  06/10/2023 69 >39 mg/dL Final   Triglycerides  Date Value Ref Range Status  06/10/2023 140 0 - 149 mg/dL Final         Passed - Patient is not pregnant      Passed - Valid encounter within last 12 months    Recent Outpatient Visits           3 months ago Type 2 diabetes mellitus with obesity (HCC)   [place] [facility] [place]. [facility] Marcine Matar, MD   1 year ago Type 2 diabetes mellitus with obesity [facility])   [place] [facility] - [facility] Dept Of [place]. [facility] Marcine Matar, MD   1 year ago Type 2 diabetes mellitus with obesity [facility])   [place] [facility] - A Dept Of [place]. [facility] Marcine Matar, MD   1 year ago Type 2 diabetes mellitus with obesity [facility])   [place] [facility] - A Dept Of [place]. [facility] Marcine Matar, MD   1 year ago Essential hypertension   [place] [facility] [place] - A Dept Of [place]. [facility] Drucilla Chalet, RPH-CPP       Future Appointments             In 3 weeks Laural Benes Binnie Rail, MD [facility] [place] - A Dept Of Eligha Bridegroom. [facility]             losartan (COZAAR) 50 MG tablet 135 tablet 0    Sig: Take 1.5 tablets (75 mg total) by mouth daily.     Cardiovascular:  Angiotensin Receptor Blockers Failed - 09/30/2023  3:46 PM      Failed - Last  BP in normal range    BP Readings from Last 1 Encounters:  06/10/23 (!) 142/82         Passed - Cr in normal range and within 180 days    Creatinine, Ser  Date Value Ref Range Status  06/10/2023 0.82 0.57 - 1.00 mg/dL Final         Passed - K in normal range and within 180 days    Potassium  Date Value Ref Range Status  06/10/2023 4.3 3.5 - 5.2 mmol/L Final         Passed - Patient is not pregnant      Passed - Valid encounter within last 6 months    Recent Outpatient Visits           3 months ago Type 2 diabetes mellitus with obesity (HCC)   [place] [facility] - [facility] Dept Of [place]. [facility] Jonah Blue B, MD   1 year ago Type 2 diabetes mellitus  with obesity [facility])   [place] [facility] - A Dept Of [place]. [facility] Marcine Matar, MD   1 year ago Type 2 diabetes mellitus with obesity [facility])   [place] [facility] - A Dept Of [place]. [facility] Marcine Matar, MD   1 year ago Type 2 diabetes mellitus with obesity [facility])   [place] [facility] - A Dept Of [place]. [facility] Marcine Matar, MD   1 year ago Essential hypertension   [place] [facility] [place] - A Dept Of [place]. [facility] Drucilla Chalet, RPH-CPP       Future Appointments             In 3 weeks Laural Benes Binnie Rail, MD [facility] [place] - A Dept Of [place] Bridegroom. [facility]             metFORMIN (GLUCOPHAGE) 500 MG tablet 180 tablet 0    Sig: Take 1 tablet (500 mg total) by mouth 2 (two) times daily with a meal.     Endocrinology:  Diabetes - Biguanides Failed - 09/30/2023  3:46 PM      Failed - B12 Level in normal range and within 720 days    No results found for: "VITAMINB12"       Failed - CBC within normal limits and completed in the last 12 months    WBC  Date Value Ref Range Status  06/10/2023 9.0 3.4 - 10.8 x10E3/uL Final   RBC   Date Value Ref Range Status  06/10/2023 4.50 3.77 - 5.28 x10E6/uL Final   Hemoglobin  Date Value Ref Range Status  06/10/2023 12.7 11.1 - 15.9 g/dL Final   Hematocrit  Date Value Ref Range Status  06/10/2023 38.5 34.0 - 46.6 % Final   MCHC  Date Value Ref Range Status  06/10/2023 33.0 31.5 - 35.7 g/dL Final   [facility]  Date Value Ref Range Status  06/10/2023 28.2 26.6 - 33.0 pg Final   MCV  Date Value Ref Range Status  06/10/2023 86 79 - 97 fL Final   No results found for: "PLTCOUNTKUC", "LABPLAT", "POCPLA" RDW  Date Value Ref Range Status  06/10/2023 13.5 11.7 - 15.4 % Final         Passed - Cr in normal range and within 360 days    Creatinine, Ser  Date Value Ref Range Status  06/10/2023 0.82 0.57 - 1.00 mg/dL Final         Passed - HBA1C is between 0 and 7.9 and within 180 days    HbA1c, POC (prediabetic range)  Date Value Ref Range Status  04/13/2022 6.0 5.7 - 6.4 % Final   HbA1c, POC (controlled diabetic range)  Date Value Ref Range Status  06/10/2023 7.8 (A) 0.0 - 7.0 % Final         Passed - eGFR in normal range and within 360 days    eGFR  Date Value Ref Range Status  06/10/2023 89 >59 mL/min/1.73 Final         Passed - Valid encounter within last 6 months    Recent Outpatient Visits           3 months ago Type 2 diabetes mellitus with obesity (HCC)   [place] [facility] Dept Of [place]. [facility] Marcine Matar, MD   1 year ago Type  2 diabetes mellitus with obesity (HCC)   [place] [facility] - A Dept Of [place]. [facility] Marcine Matar, MD   1 year ago Type 2 diabetes mellitus with obesity [facility])   [place] [facility] - A Dept Of [place]. [facility] Marcine Matar, MD   1 year ago Type 2 diabetes mellitus with obesity [facility])   [place] [facility] - A Dept Of [place]. [facility] Marcine Matar, MD   1 year ago Essential hypertension    [place] [facility] [place] - A Dept Of [place]. [facility] Drucilla Chalet, RPH-CPP       Future Appointments             In 3 weeks Laural Benes Binnie Rail, MD [facility] [place] - A Dept Of [place]igh[place] Bridegroom. [facility]

## 2023-10-01 ENCOUNTER — Other Ambulatory Visit: Payer: Self-pay

## 2023-10-12 ENCOUNTER — Ambulatory Visit: Payer: Self-pay | Admitting: Internal Medicine

## 2023-10-26 ENCOUNTER — Ambulatory Visit: Payer: Self-pay | Admitting: Internal Medicine

## 2023-12-23 ENCOUNTER — Ambulatory Visit: Payer: Self-pay | Admitting: Internal Medicine

## 2024-01-11 ENCOUNTER — Other Ambulatory Visit: Payer: Self-pay

## 2024-01-11 ENCOUNTER — Other Ambulatory Visit: Payer: Self-pay | Admitting: Internal Medicine

## 2024-01-11 DIAGNOSIS — E1169 Type 2 diabetes mellitus with other specified complication: Secondary | ICD-10-CM

## 2024-01-11 DIAGNOSIS — E1159 Type 2 diabetes mellitus with other circulatory complications: Secondary | ICD-10-CM

## 2024-01-12 ENCOUNTER — Other Ambulatory Visit: Payer: Self-pay

## 2024-01-12 MED ORDER — ATORVASTATIN CALCIUM 20 MG PO TABS
20.0000 mg | ORAL_TABLET | Freq: Every day | ORAL | 0 refills | Status: DC
Start: 2024-01-12 — End: 2024-02-01
  Filled 2024-01-12: qty 30, 30d supply, fill #0

## 2024-01-12 MED ORDER — METFORMIN HCL 500 MG PO TABS
500.0000 mg | ORAL_TABLET | Freq: Two times a day (BID) | ORAL | 0 refills | Status: DC
  Filled 2024-01-12: qty 60, 30d supply, fill #0

## 2024-01-12 MED ORDER — LOSARTAN POTASSIUM 50 MG PO TABS
75.0000 mg | ORAL_TABLET | Freq: Every day | ORAL | 0 refills | Status: DC
Start: 2024-01-12 — End: 2024-02-01
  Filled 2024-01-12: qty 45, 30d supply, fill #0

## 2024-01-13 ENCOUNTER — Other Ambulatory Visit: Payer: Self-pay

## 2024-02-01 ENCOUNTER — Encounter: Payer: Self-pay | Admitting: Internal Medicine

## 2024-02-01 ENCOUNTER — Ambulatory Visit: Payer: Self-pay | Attending: Internal Medicine | Admitting: Internal Medicine

## 2024-02-01 ENCOUNTER — Other Ambulatory Visit: Payer: Self-pay

## 2024-02-01 VITALS — BP 123/81 | HR 79 | Temp 97.9°F | Ht 63.0 in | Wt 186.0 lb

## 2024-02-01 DIAGNOSIS — E669 Obesity, unspecified: Secondary | ICD-10-CM

## 2024-02-01 DIAGNOSIS — E119 Type 2 diabetes mellitus without complications: Secondary | ICD-10-CM

## 2024-02-01 DIAGNOSIS — Z1211 Encounter for screening for malignant neoplasm of colon: Secondary | ICD-10-CM

## 2024-02-01 DIAGNOSIS — I10 Essential (primary) hypertension: Secondary | ICD-10-CM

## 2024-02-01 DIAGNOSIS — Z7984 Long term (current) use of oral hypoglycemic drugs: Secondary | ICD-10-CM

## 2024-02-01 DIAGNOSIS — E1169 Type 2 diabetes mellitus with other specified complication: Secondary | ICD-10-CM

## 2024-02-01 DIAGNOSIS — E785 Hyperlipidemia, unspecified: Secondary | ICD-10-CM

## 2024-02-01 DIAGNOSIS — Z6832 Body mass index (BMI) 32.0-32.9, adult: Secondary | ICD-10-CM

## 2024-02-01 DIAGNOSIS — F17200 Nicotine dependence, unspecified, uncomplicated: Secondary | ICD-10-CM

## 2024-02-01 DIAGNOSIS — E1159 Type 2 diabetes mellitus with other circulatory complications: Secondary | ICD-10-CM

## 2024-02-01 DIAGNOSIS — F17210 Nicotine dependence, cigarettes, uncomplicated: Secondary | ICD-10-CM

## 2024-02-01 LAB — POCT GLYCOSYLATED HEMOGLOBIN (HGB A1C): HbA1c, POC (controlled diabetic range): 7 % (ref 0.0–7.0)

## 2024-02-01 LAB — GLUCOSE, POCT (MANUAL RESULT ENTRY): POC Glucose: 151 mg/dL — AB (ref 70–99)

## 2024-02-01 MED ORDER — TRUE METRIX BLOOD GLUCOSE TEST VI STRP
ORAL_STRIP | 2 refills | Status: AC
  Filled 2024-02-01: qty 100, 25d supply, fill #0

## 2024-02-01 MED ORDER — METFORMIN HCL 500 MG PO TABS
ORAL_TABLET | ORAL | 1 refills | Status: DC
  Filled 2024-02-01: qty 270, fill #0
  Filled 2024-02-22: qty 270, 90d supply, fill #0
  Filled 2024-05-23: qty 270, 90d supply, fill #1

## 2024-02-01 MED ORDER — LOSARTAN POTASSIUM 50 MG PO TABS
75.0000 mg | ORAL_TABLET | Freq: Every day | ORAL | 1 refills | Status: DC
  Filled 2024-02-01 – 2024-02-22 (×2): qty 135, 90d supply, fill #0
  Filled 2024-05-23: qty 135, 90d supply, fill #1

## 2024-02-01 MED ORDER — ATORVASTATIN CALCIUM 20 MG PO TABS
20.0000 mg | ORAL_TABLET | Freq: Every day | ORAL | 1 refills | Status: DC
  Filled 2024-02-01 – 2024-02-22 (×2): qty 90, 90d supply, fill #0
  Filled 2024-05-23: qty 90, 90d supply, fill #1

## 2024-02-01 NOTE — Progress Notes (Signed)
 Patient ID: Jessica Hancock, female    DOB: Dec 24, 1975  MRN: [id number]  CC: Diabetes (DM f/u. Med refill. /No questions / concerns/)   Subjective: [name] is a 48 y.o. female who presents for chronic ds management. Her concerns today include:  Pt with hx of DM, HL, HTN, obesity, tob dep, ETOH use disorder.     AMN Language interpreter used during this encounter. Ellwood Handler [id number]  DIABETES TYPE 2/Obesity: Last A1C:   Results for orders placed or performed in visit on 02/01/24  POCT glucose (manual entry)   Collection Time: 02/01/24 10:56 AM  Result Value Ref Range   POC Glucose 151 (A) 70 - 99 mg/dl  POCT glycosylated hemoglobin (Hb A1C)   Collection Time: 02/01/24 11:13 AM  Result Value Ref Range   Hemoglobin A1C     HbA1c POC (<> result, manual entry)     HbA1c, POC (prediabetic range)     HbA1c, POC (controlled diabetic range) 7.0 0.0 - 7.0 %    Med Adherence:  [x]  Yes - on Metformin 500 mg BID   Medication side effects:  []  Yes    [x]  No Home Monitoring?  [x]  Yes - 1-2x/wk Home glucose results range: reports readings 86-130s Diet Adherence: [x]  Yes -low fat intake, eating more veggies and not using sugar  Exercise: [x]  Yes - walks 2x/wk Hypoglycemic episodes?: []  Yes    [x]  No Numbness of the feet? []  Yes    [x]  No Last eye exam: over due for exam.  No insurance Comments:  taking and tolerating Lipitor 20 mg daily.  HYPERTENSION Currently taking: see medication list - Cozaar 75 mg daily Med Adherence: [x]  Yes    []  No Medication side effects: []  Yes    [x]  No Adherence with salt restriction: [x]  Yes    []  No Home Monitoring?: []  Yes    []  No Monitoring Frequency: 1-2x/wk Home BP results range: 129-130/80-82 SOB? []  Yes    [x]  No Chest Pain?: []  Yes    [x]  No Leg swelling?: []  Yes    [x]  No Headaches?: []  Yes    []  No Dizziness? []  Yes    []  No Comments:   Tob dep: still smoking; working on quitting.  Down to 1-2 cigarettes a  wk.  Did not tolerate patches or chantix  HM:  has FIT kit at home given on last visit 05/2023.  Will use and bring in.  Patient Active Problem List   Diagnosis Date Noted   Diabetes Mellitus, Type 2 06/17/2021   Hyperlipidemia associated with type 2 diabetes mellitus (HCC) 06/17/2021   Obesity 06/17/2021   Tobacco use 06/17/2021   Alcohol consumption heavy 06/17/2021     Current Outpatient Medications on File Prior to Visit  Medication Sig Dispense Refill   Blood Glucose Monitoring Suppl (TRUE METRIX METER) w/Device KIT Use as directed 1 kit 0   glucose blood (TRUE METRIX BLOOD GLUCOSE TEST) test strip Use as instructed 100 each 3   TRUEplus Lancets 28G MISC Use as directed 100 each 2   varenicline (CHANTIX) 0.5 MG tablet Take 1 tablet by mouth daily for 3 days, then increase to 1 tablet twice daily until finished. (Patient not taking: Reported on 02/01/2024) 11 tablet 0   varenicline (CHANTIX) 1 MG tablet Take 1 tablet by mouth 2 times daily. Do not start this until you complete the 0.5 mg prescription. (Patient not taking: Reported on 02/01/2024) 60 tablet 1   No current facility-administered  medications on file prior to visit.    No Known Allergies  Social History   Socioeconomic History   Marital status: Married    Spouse name: Not on file   Number of children: 1   Years of education: Not on file   Highest education level: Associate degree: occupational, Scientist, product/process development, or vocational program  Occupational History   Not on file  Tobacco Use   Smoking status: Every Day    Current packs/day: 0.10    Average packs/day: 0.1 packs/day for 15.0 years (1.5 ttl pk-yrs)    Types: Cigarettes   Smokeless tobacco: Never   Tobacco comments:    Not every day 12/23/22  Vaping Use   Vaping status: Never Used  Substance and Sexual Activity   Alcohol use: Yes    Comment: occasionally   Drug use: Never   Sexual activity: Yes    Birth control/protection: None  Other Topics Concern   Not on  file  Social History Narrative   ** Merged History Encounter **       Social Drivers of Health   Financial Resource Strain: Not on file  Food Insecurity: No Food Insecurity (12/23/2022)   Hunger Vital Sign    Worried About Running Out of Food in the Last Year: Never true    Ran Out of Food in the Last Year: Never true  Transportation Needs: No Transportation Needs (12/23/2022)   PRAPARE - Administrator, Civil Service (Medical): No    Lack of Transportation (Non-Medical): No  Physical Activity: Not on file  Stress: Not on file  Social Connections: Not on file  Intimate Partner Violence: Not on file    Family History  Problem Relation Age of Onset   Diabetes Mother    Breast cancer Mother    Hypertension Father     No past surgical history on file.  ROS: Review of Systems Negative except as stated above  PHYSICAL EXAM: BP 123/81   Pulse 79   Temp 97.9 F (36.6 C) (Oral)   Ht 5\' 3"  (1.6 m)   Wt 186 lb (84.4 kg)   SpO2 99%   BMI 32.95 kg/m   Physical Exam  General appearance - alert, well appearing, middle age Hispanic female and in no distress Mental status - alert, oriented to person, place, and time Mouth - mucous membranes moist, pharynx normal without lesions Neck - supple, no significant adenopathy Extremities - peripheral pulses normal, no pedal edema, no clubbing or cyanosis Diabetic Foot Exam - Simple   Simple Foot Form Diabetic Foot exam was performed with the following findings: Yes 02/01/2024 12:00 PM  Visual Inspection See comments: Yes Sensation Testing Intact to touch and monofilament testing bilaterally: Yes Pulse Check Posterior Tibialis and Dorsalis pulse intact bilaterally: Yes Comments Ingrown toenail RT big toe         Latest Ref Rng & Units 06/10/2023    2:32 PM 06/30/2022    9:11 AM 06/24/2022   10:57 AM  CMP  Glucose 70 - 99 mg/dL 409  811  914   BUN 6 - 24 mg/dL 12     Creatinine 7.82 - 1.00 mg/dL 9.56     Sodium 213  - 144 mmol/L 136     Potassium 3.5 - 5.2 mmol/L 4.3     Chloride 96 - 106 mmol/L 99     CO2 20 - 29 mmol/L 21     Calcium 8.7 - 10.2 mg/dL 9.6     Total  Protein 6.0 - 8.5 g/dL 7.0     Total Bilirubin 0.0 - 1.2 mg/dL 0.3     Alkaline Phos 44 - 121 IU/L 89     AST 0 - 40 IU/L 30     ALT 0 - 32 IU/L 29      Lipid Panel     Component Value Date/Time   CHOL 177 06/10/2023 1432   TRIG 140 06/10/2023 1432   HDL 69 06/10/2023 1432   CHOLHDL 2.6 06/10/2023 1432   LDLCALC 84 06/10/2023 1432    CBC    Component Value Date/Time   WBC 9.0 06/10/2023 1432   RBC 4.50 06/10/2023 1432   HGB 12.7 06/10/2023 1432   HCT 38.5 06/10/2023 1432   PLT 303 06/10/2023 1432   MCV 86 06/10/2023 1432   MCH 28.2 06/10/2023 1432   MCHC 33.0 06/10/2023 1432   RDW 13.5 06/10/2023 1432    ASSESSMENT AND PLAN: 1. Type 2 diabetes mellitus with obesity (HCC) (Primary) Close to goal Continue healthy eating habits.  Try to increase exercise to 3-5x/wk Increase Metformin to 1 gram a.m and 500 mg p.m Get eye exam when she can afford - POCT glycosylated hemoglobin (Hb A1C) - POCT glucose (manual entry) - metFORMIN (GLUCOPHAGE) 500 MG tablet; Take 2 tablets (1,000 mg total) by mouth in the morning AND 1 tablet (500 mg total) every evening.  Dispense: 270 tablet; Refill: 1 - glucose blood (TRUE METRIX BLOOD GLUCOSE TEST) test strip; Use as instructed  Dispense: 100 each; Refill: 2 - Microalbumin / creatinine urine ratio  2. Hyperlipidemia associated with type 2 diabetes mellitus (HCC) - atorvastatin (LIPITOR) 20 MG tablet; Take 1 tablet (20 mg total) by mouth daily.  Dispense: 90 tablet; Refill: 1  3. Hypertension associated with diabetes (HCC) Close to goal. Continue Cozaar 75 mg daily - losartan (COZAAR) 50 MG tablet; Take 1.5 tablets (75 mg total) by mouth daily.  Dispense: 135 tablet; Refill: 1  4. Tobacco dependence Strongly advise to quit.    5. Screening for colon cancer Encourage to use and  bring in FIT test for screening   Patient was given the opportunity to ask questions.  Patient verbalized understanding of the plan and was able to repeat key elements of the plan.   This documentation was completed using Paediatric nurse.  Any transcriptional errors are unintentional.  Orders Placed This Encounter  Procedures   Microalbumin / creatinine urine ratio   POCT glycosylated hemoglobin (Hb A1C)   POCT glucose (manual entry)     Requested Prescriptions   Signed Prescriptions Disp Refills   metFORMIN (GLUCOPHAGE) 500 MG tablet 270 tablet 1    Sig: Take 2 tablets (1,000 mg total) by mouth in the morning AND 1 tablet (500 mg total) every evening.   atorvastatin (LIPITOR) 20 MG tablet 90 tablet 1    Sig: Take 1 tablet (20 mg total) by mouth daily.   losartan (COZAAR) 50 MG tablet 135 tablet 1    Sig: Take 1.5 tablets (75 mg total) by mouth daily.   glucose blood (TRUE METRIX BLOOD GLUCOSE TEST) test strip 100 each 2    Sig: Use as instructed    Return in about 5 months (around 07/03/2024).  Jonah Blue, MD, FACP

## 2024-02-03 ENCOUNTER — Other Ambulatory Visit: Payer: Self-pay

## 2024-02-03 LAB — MICROALBUMIN / CREATININE URINE RATIO
Creatinine, Urine: 110.7 mg/dL
Microalb/Creat Ratio: 19 mg/g{creat} (ref 0–29)
Microalbumin, Urine: 20.9 ug/mL

## 2024-02-04 ENCOUNTER — Encounter: Payer: Self-pay | Admitting: Internal Medicine

## 2024-02-22 ENCOUNTER — Other Ambulatory Visit: Payer: Self-pay

## 2024-02-24 ENCOUNTER — Other Ambulatory Visit: Payer: Self-pay

## 2024-03-10 LAB — HM DIABETES EYE EXAM

## 2024-05-23 ENCOUNTER — Other Ambulatory Visit: Payer: Self-pay

## 2024-05-24 ENCOUNTER — Other Ambulatory Visit: Payer: Self-pay

## 2024-06-20 NOTE — Progress Notes (Unsigned)
 [facility] initial screening   Interpreter- Bernice Angry, [place]   Clinical Measurement:  Vitals:   06/09/24 0900 06/09/24 0920  BP: 125/80 124/75   Fasting Labs Drawn Today, will review with patient when they result.   Medical History: Patient states that she does not know if she has high cholesterol, does not have high blood pressure and she does not have diabetes. Patient states that she does not have history of gestational hypertension, does not have history of pre-eclampsia/eclampsia and she does not have history of gestational diabetes.    Medications: Patient states that she does not take medication to lower cholesterol, blood pressure or blood sugar.  Patient does not take an aspirin  a day to help prevent a heart attack or stroke.    Blood pressure, self measurement: Patient states that she does not measure blood pressure from home. She checks her blood pressure N/A. She shares her readings with a health care provider: N/A.   Nutrition: Patient states that on average she eats 3 cups of fruit and 1 cups of vegetables per day. Patient states that she does not eat fish at least 2 times per week. Patient eats about half servings of whole grains. Patient drinks less than 36 ounces of beverages with added sugar weekly: yes. Patient is currently watching sodium or salt intake: yes. In the past 7 days patient has consumed drinks containing alcohol on 0 days. On a day that patient consumes drinks containing alcohol on average 0 drinks are consumed.      Physical activity: Patient states that she gets 60 minutes of physical activity each week.  Smoking status: Patient states that she has has never smoked .   Quality of life: Over the past 2 weeks patient states that she had little interest or pleasure in doing things: not at all. She has been feeling down, depressed or hopeless:not at all.   Social Determinants of Health Assessment:   Computer Use: During the last 12 months patient states  that she has used any of the following: desktop/laptop, smart phone or tablet/other portable wireless computer: yes.   Internet Use: During the last 12 months, did you or any member of your household have access to the internet: Yes, by paying a cell phone company or internet service provider.   Food Insecurities: During the last 12 months, where there any times when you were worried that you would run out of food because of a lack of money or other resources: No.   Transportation Barriers: During the last 12 months, have you missed a doctor's appointment because of transportation problems: No.   Childcare Barriers: If you are currently using childcare services, please identify  the type of services you use. (If not using childcare services, please select Not applicable): not applicable. During the last 12 months, have you had any barriers to childcare services such as: not applicable.   Housing: What is your housing situation today: I have housing.   Intimate Partner Violence: During the last 12 months, how often did your partner physically hurt you: never. During the last 12 months, how often did your partner insult you or talk down to you: never.  Medication Adherence: During the last 12 months, did you ever forget to take your medicine: not applicable. During the last 12 months, were you careless ar times about taking your medicine: not applicable. During the last 12 months, when you felt better did you sometimes stop taking your medication: not applicable. During the last 12 months,  sometimes if you felt worse when you took your medicine did you stop taking it: not applicable.   Risk reduction and counseling:   Health Coaching: Patient consumes a good amount of fruit (3-4 servings) daily. Patient consumes at least a servings of vegetables on most days. Spoke with patient about the daily recommendation for vegetable intake and showed her what a serving would look like. Patient usually  consumes fish once a week. I believe that she fries the fish most of the time. Spoke with patient about heart healthy ways that she can prepare fish such as: grilling, baking, sauteeing in a pan with a small amount of olive oil or by using an air fryer. Patient consumes whole wheat bread and oatmeal regularly. Patient consumes 2-3 cups of soda on the weekends. Encouraged her to try and keep her sweetened beverage intake to below 36 oz weekly. Patient tries to walk 2-3 days a week for 30-40 minutes.  Goal: Patient will increase vegetable intake to 2 servings daily. Patient will work on reaching this goal over the next month.   Navigation:  I will notify patient of lab results. Patient is aware of 2 more health coaching sessions and a follow up.

## 2024-06-21 ENCOUNTER — Other Ambulatory Visit: Payer: Self-pay

## 2024-06-21 ENCOUNTER — Inpatient Hospital Stay: Payer: Self-pay | Attending: Obstetrics and Gynecology | Admitting: *Deleted

## 2024-06-21 VITALS — BP 117/75 | Ht 63.0 in | Wt 185.0 lb

## 2024-06-21 DIAGNOSIS — Z1231 Encounter for screening mammogram for malignant neoplasm of breast: Secondary | ICD-10-CM

## 2024-06-21 DIAGNOSIS — Z0000 Encounter for general adult medical examination without abnormal findings: Secondary | ICD-10-CM

## 2024-06-22 LAB — LIPID PANEL
Chol/HDL Ratio: 2.3 ratio (ref 0.0–4.4)
Cholesterol, Total: 159 mg/dL (ref 100–199)
HDL: 70 mg/dL (ref >39)
LDL Chol Calc (NIH): 75 mg/dL (ref 0–99)
Triglycerides: 70 mg/dL (ref 0–149)
VLDL Cholesterol Cal: 14 mg/dL (ref 5–40)

## 2024-06-22 LAB — GLUCOSE, RANDOM: Glucose: 124 mg/dL — ABNORMAL HIGH (ref 70–99)

## 2024-06-22 LAB — HEMOGLOBIN A1C
Est. average glucose Bld gHb Est-mCnc: 143 mg/dL
Hgb A1c MFr Bld: 6.6 % — ABNORMAL HIGH (ref 4.8–5.6)

## 2024-06-27 ENCOUNTER — Telehealth: Payer: Self-pay

## 2024-06-27 NOTE — Telephone Encounter (Signed)
 Health coaching 2   interpreter- [employer] [phone]   Labs- 159 cholesterol, 75 LDL cholesterol, 70 triglycerides, 70 HDL cholesterol, 6.6 hemoglobin A1C, 124 mean plasma glucose. Patient understands and is aware of her lab results.   Goals-  - Cholesterol numbers are well within normal range. Statin is working to keep numbers within normal range. Continue taking medication daily as prescribed. Continue to practice a heart healthy diet by watching the amount the amount of fried and fatty foods consumed. Continue daily walks for 20-30 minutes.  - Glucose and HbA1C are elevated but are in the controlled range for someone with a hx of T2D. Continue to take medications daily as prescribed. Continue watching the amount of sweet and sugary foods and drinks consumed. Continue watching the amount of carb rich foods consumed.   Navigation:  Patient is aware of 1 more health coaching sessions and a follow up. Patient is scheduled for FU with PCP @ [facility] on 07/03/24 @ 10:10 am.

## 2024-06-29 ENCOUNTER — Ambulatory Visit
Admission: RE | Admit: 2024-06-29 | Discharge: 2024-06-29 | Disposition: A | Discharge status: Home / Self Care | Payer: Self-pay | Source: Ambulatory Visit | Attending: Internal Medicine | Admitting: Internal Medicine

## 2024-06-29 DIAGNOSIS — Z1231 Encounter for screening mammogram for malignant neoplasm of breast: Secondary | ICD-10-CM

## 2024-07-03 ENCOUNTER — Encounter: Payer: Self-pay | Admitting: Internal Medicine

## 2024-07-03 ENCOUNTER — Other Ambulatory Visit: Payer: Self-pay

## 2024-07-03 ENCOUNTER — Ambulatory Visit: Payer: Self-pay | Attending: Internal Medicine | Admitting: Internal Medicine

## 2024-07-03 VITALS — BP 129/78 | HR 81 | Temp 98.2°F | Ht 63.0 in | Wt 185.0 lb

## 2024-07-03 DIAGNOSIS — E1169 Type 2 diabetes mellitus with other specified complication: Secondary | ICD-10-CM

## 2024-07-03 DIAGNOSIS — I152 Hypertension secondary to endocrine disorders: Secondary | ICD-10-CM

## 2024-07-03 DIAGNOSIS — E1159 Type 2 diabetes mellitus with other circulatory complications: Secondary | ICD-10-CM

## 2024-07-03 DIAGNOSIS — Z7984 Long term (current) use of oral hypoglycemic drugs: Secondary | ICD-10-CM

## 2024-07-03 DIAGNOSIS — Z23 Encounter for immunization: Secondary | ICD-10-CM

## 2024-07-03 DIAGNOSIS — Z1211 Encounter for screening for malignant neoplasm of colon: Secondary | ICD-10-CM

## 2024-07-03 DIAGNOSIS — Z6832 Body mass index (BMI) 32.0-32.9, adult: Secondary | ICD-10-CM

## 2024-07-03 DIAGNOSIS — E669 Obesity, unspecified: Secondary | ICD-10-CM

## 2024-07-03 DIAGNOSIS — E785 Hyperlipidemia, unspecified: Secondary | ICD-10-CM

## 2024-07-03 DIAGNOSIS — Z79899 Other long term (current) drug therapy: Secondary | ICD-10-CM

## 2024-07-03 DIAGNOSIS — F17200 Nicotine dependence, unspecified, uncomplicated: Secondary | ICD-10-CM

## 2024-07-03 MED ORDER — ATORVASTATIN CALCIUM 20 MG PO TABS
20.0000 mg | ORAL_TABLET | Freq: Every day | ORAL | 1 refills | Status: AC
  Filled 2024-07-03 – 2024-08-24 (×2): qty 90, 90d supply, fill #0

## 2024-07-03 MED ORDER — METFORMIN HCL 500 MG PO TABS
ORAL_TABLET | ORAL | 1 refills | Status: AC
Start: 2024-07-03 — End: ?
  Filled 2024-07-03 – 2024-08-24 (×2): qty 270, 90d supply, fill #0

## 2024-07-03 MED ORDER — LOSARTAN POTASSIUM 50 MG PO TABS
75.0000 mg | ORAL_TABLET | Freq: Every day | ORAL | 1 refills | Status: AC
  Filled 2024-07-03 – 2024-08-24 (×2): qty 135, 90d supply, fill #0

## 2024-07-03 NOTE — Progress Notes (Signed)
 Patient ID: Jessica Hancock, female    DOB: 1976/10/11  MRN: [id number]  CC: Diabetes (DM f/u. Med refills. /No questions / concerns/Flu vax administered on 07/03/2024 - C.A. /Mammogram done on 02/28/24 - on bus)   Subjective: Jessica Hancock is a 48 y.o. female who presents for chronic ds management. Her concerns today include:  Pt with hx of DM, HL, HTN, obesity, tob dep, ETOH use disorder.    AMN Language interpreter used during this encounter. #[id number], [place]   Discussed the use of AI scribe software for clinical note transcription with the patient, who gave verbal consent to proceed.  History of Present Illness Jessica Hancock is a 48 year old female with diabetes, hypertension, and hyperlipidemia who presents for a follow-up visit.  DM Lab Results  Component Value Date   HGBA1C 6.6 (H) 06/21/2024  She manages her diabetes with metformin  500 mg twice daily. Her recent A1c was 6.6. She checks her blood sugar two to three times a week, with readings from 79 to 125 mg/dL. She experiences shakiness and nausea when her blood sugar drops to 79 mg/dL, occurring about once a week. She engages in daily walking for 15 to 20 minutes.  HTN: she takes Cozaar  50 mg, 1.5 tablets daily. She limits salt intake in her diet.No CP/SOB/LE edema  HL: she takes atorvastatin  20 mg daily. Her LDL cholesterol has improved from 84 mg/dL to 75 mg/dL.  Tob use: She has reduced smoking to one to two cigarettes per week, occasionally smoking more during certain events. She previously did not tolerate Chantix  or nicotine  patches due to nausea.   HM: yes to flu shot. Due for hep B vaccine. Advised can get through HD. Turned in FIT test today.  Patient Active Problem List   Diagnosis Date Noted   Diabetes Mellitus, Type 2 06/17/2021   Hyperlipidemia associated with type 2 diabetes mellitus (HCC) 06/17/2021   Obesity 06/17/2021   Tobacco use 06/17/2021   Alcohol  consumption heavy 06/17/2021     Current Outpatient Medications on File Prior to Visit  Medication Sig Dispense Refill   Blood Glucose Monitoring Suppl (TRUE METRIX METER) w/Device KIT Use as directed 1 kit 0   glucose blood (TRUE METRIX BLOOD GLUCOSE TEST) test strip Use as instructed 100 each 3   glucose blood (TRUE METRIX BLOOD GLUCOSE TEST) test strip Use as instructed 100 each 2   TRUEplus Lancets 28G MISC Use as directed 100 each 2   No current facility-administered medications on file prior to visit.    No Known Allergies  Social History   Socioeconomic History   Marital status: Married    Spouse name: Not on file   Number of children: 1   Years of education: Not on file   Highest education level: Associate degree: occupational, Scientist, product/process development, or vocational program  Occupational History   Not on file  Tobacco Use   Smoking status: Every Day    Current packs/day: 0.10    Average packs/day: 0.1 packs/day for 15.0 years (1.5 ttl pk-yrs)    Types: Cigarettes   Smokeless tobacco: Never   Tobacco comments:    X1 weekly 06/21/24  Vaping Use   Vaping status: Never Used  Substance and Sexual Activity   Alcohol use: Yes    Comment: occasionally   Drug use: Never   Sexual activity: Yes    Birth control/protection: None  Other Topics Concern   Not on file  Social History Narrative   **  Merged History Encounter **       Social Drivers of Health   Financial Resource Strain: Low Risk  (07/03/2024)   Overall Financial Resource Strain (CARDIA)    Difficulty of Paying Living Expenses: Not very hard  Food Insecurity: No Food Insecurity (07/03/2024)   Hunger Vital Sign    Worried About Running Out of Food in the Last Year: Never true    Ran Out of Food in the Last Year: Never true  Transportation Needs: No Transportation Needs (07/03/2024)   PRAPARE - Administrator, Civil Service (Medical): No    Lack of Transportation (Non-Medical): No  Physical Activity:  Insufficiently Active (07/03/2024)   Exercise Vital Sign    Days of Exercise per Week: 5 days    Minutes of Exercise per Session: 20 min  Stress: No Stress Concern Present (07/03/2024)   [employer] of Occupational Health - Occupational Stress Questionnaire    Feeling of Stress: Not at all  Social Connections: Moderately Integrated (07/03/2024)   Social Connection and Isolation Panel    Frequency of Communication with Friends and Family: Never    Frequency of Social Gatherings with Friends and Family: More than three times a week    Attends Religious Services: More than 4 times per year    Active Member of [employer] or Organizations: No    Attends Banker Meetings: Never    Marital Status: Married  Catering manager Violence: Not At Risk (07/03/2024)   Humiliation, Afraid, Rape, and Kick questionnaire    Fear of Current or Ex-Partner: No    Emotionally Abused: No    Physically Abused: No    Sexually Abused: No    Family History  Problem Relation Age of Onset   Diabetes Mother    Breast cancer Mother    Hypertension Father     No past surgical history on file.  ROS: Review of Systems Negative except as stated above  PHYSICAL EXAM: BP 129/78 (BP Location: Left Arm, Patient Position: Sitting, Cuff Size: Normal)   Pulse 81   Temp 98.2 F (36.8 C) (Oral)   Ht 5' 3 (1.6 m)   Wt 185 lb (83.9 kg)   LMP 06/19/2024 (Exact Date)   SpO2 98%   BMI 32.77 kg/m   Wt Readings from Last 3 Encounters:  07/03/24 185 lb (83.9 kg)  06/21/24 185 lb (83.9 kg)  02/01/24 186 lb (84.4 kg)    Physical Exam  General appearance - alert, well appearing, middle age Hispanic female and in no distress Mental status - normal mood, behavior, speech, dress, motor activity, and thought processes Neck - supple, no significant adenopathy Chest - clear to auscultation, no wheezes, rales or rhonchi, symmetric air entry Heart - normal rate, regular rhythm, normal S1, S2, no murmurs, rubs,  clicks or gallops Extremities - peripheral pulses normal, no pedal edema, no clubbing or cyanosis      Latest Ref Rng & Units 06/21/2024    9:26 AM 06/10/2023    2:32 PM 06/30/2022    9:11 AM  CMP  Glucose 70 - 99 mg/dL 875  791  883   BUN 6 - 24 mg/dL  12    Creatinine 9.42 - 1.00 mg/dL  9.17    Sodium 865 - 855 mmol/L  136    Potassium 3.5 - 5.2 mmol/L  4.3    Chloride 96 - 106 mmol/L  99    CO2 20 - 29 mmol/L  21  Calcium  8.7 - 10.2 mg/dL  9.6    Total Protein 6.0 - 8.5 g/dL  7.0    Total Bilirubin 0.0 - 1.2 mg/dL  0.3    Alkaline Phos 44 - 121 IU/L  89    AST 0 - 40 IU/L  30    ALT 0 - 32 IU/L  29     Lipid Panel     Component Value Date/Time   CHOL 159 06/21/2024 0926   TRIG 70 06/21/2024 0926   HDL 70 06/21/2024 0926   CHOLHDL 2.3 06/21/2024 0926   LDLCALC 75 06/21/2024 0926    CBC    Component Value Date/Time   WBC 9.0 06/10/2023 1432   RBC 4.50 06/10/2023 1432   HGB 12.7 06/10/2023 1432   HCT 38.5 06/10/2023 1432   PLT 303 06/10/2023 1432   MCV 86 06/10/2023 1432   MCH 28.2 06/10/2023 1432   MCHC 33.0 06/10/2023 1432   RDW 13.5 06/10/2023 1432    ASSESSMENT AND PLAN: 1. Type 2 diabetes mellitus with obesity (HCC) (Primary) At goal. Continue Metformin , healthy eating and regular exercise - metFORMIN  (GLUCOPHAGE ) 500 MG tablet; Take 2 tablets (1,000 mg total) by mouth in the morning AND 1 tablet (500 mg total) every evening.  Dispense: 270 tablet; Refill: 1 - Comprehensive metabolic panel with GFR - CBC  2. Hyperlipidemia associated with type 2 diabetes mellitus (HCC) Improved.  - atorvastatin  (LIPITOR) 20 MG tablet; Take 1 tablet (20 mg total) by mouth daily.  Dispense: 90 tablet; Refill: 1  3. Hypertension associated with diabetes (HCC) At goal; continue Cozaar  75 mg daily - losartan  (COZAAR ) 50 MG tablet; Take 1.5 tablets (75 mg total) by mouth daily.  Dispense: 135 tablet; Refill: 1  4. Tobacco dependence Strongly advised to quit.  5. Need for  influenza vaccination Given today  6. Screening for colon cancer - Fecal occult blood, imunochemical([facility])   Patient was given the opportunity to ask questions.  Patient verbalized understanding of the plan and was able to repeat key elements of the plan.   This documentation was completed using Paediatric nurse.  Any transcriptional errors are unintentional.  Orders Placed This Encounter  Procedures   Fecal occult blood, imunochemical([facility])   Flu vaccine trivalent PF, 6mos and older(Flulaval,Afluria,Fluarix,Fluzone)   Comprehensive metabolic panel with GFR   CBC     Requested Prescriptions   Signed Prescriptions Disp Refills   atorvastatin  (LIPITOR) 20 MG tablet 90 tablet 1    Sig: Take 1 tablet (20 mg total) by mouth daily.   losartan  (COZAAR ) 50 MG tablet 135 tablet 1    Sig: Take 1.5 tablets (75 mg total) by mouth daily.   metFORMIN  (GLUCOPHAGE ) 500 MG tablet 270 tablet 1    Sig: Take 2 tablets (1,000 mg total) by mouth in the morning AND 1 tablet (500 mg total) every evening.    Return in about 4 months (around 11/02/2024).  Barnie Louder, MD, FACP

## 2024-07-04 ENCOUNTER — Ambulatory Visit: Payer: Self-pay | Admitting: Internal Medicine

## 2024-07-04 LAB — COMPREHENSIVE METABOLIC PANEL WITH GFR
ALT: 14 IU/L (ref 0–32)
AST: 17 IU/L (ref 0–40)
Albumin: 4.6 g/dL (ref 3.9–4.9)
Alkaline Phosphatase: 86 IU/L (ref 41–116)
BUN/Creatinine Ratio: 15 (ref 9–23)
BUN: 11 mg/dL (ref 6–24)
Bilirubin Total: 0.5 mg/dL (ref 0.0–1.2)
CO2: 18 mmol/L — ABNORMAL LOW (ref 20–29)
Calcium: 9.7 mg/dL (ref 8.7–10.2)
Chloride: 102 mmol/L (ref 96–106)
Creatinine, Ser: 0.72 mg/dL (ref 0.57–1.00)
Globulin, Total: 2.5 g/dL (ref 1.5–4.5)
Glucose: 114 mg/dL — ABNORMAL HIGH (ref 70–99)
Potassium: 4.3 mmol/L (ref 3.5–5.2)
Sodium: 138 mmol/L (ref 134–144)
Total Protein: 7.1 g/dL (ref 6.0–8.5)
eGFR: 104 mL/min/1.73 (ref >59)

## 2024-07-04 LAB — CBC
Hematocrit: 39.1 % (ref 34.0–46.6)
Hemoglobin: 12.7 g/dL (ref 11.1–15.9)
MCH: 28 pg (ref 26.6–33.0)
MCHC: 32.5 g/dL (ref 31.5–35.7)
MCV: 86 fL (ref 79–97)
Platelets: 334 x10E3/uL (ref 150–450)
RBC: 4.53 x10E6/uL (ref 3.77–5.28)
RDW: 13.7 % (ref 11.7–15.4)
WBC: 8.9 x10E3/uL (ref 3.4–10.8)

## 2024-07-05 LAB — FECAL OCCULT BLOOD, IMMUNOCHEMICAL: Fecal Occult Bld: NEGATIVE

## 2024-08-24 ENCOUNTER — Other Ambulatory Visit: Payer: Self-pay

## 2024-11-02 ENCOUNTER — Ambulatory Visit: Payer: Self-pay | Admitting: Internal Medicine

## 2024-12-05 ENCOUNTER — Ambulatory Visit: Payer: Self-pay | Admitting: Internal Medicine
